# Patient Record
Sex: Female | Born: 1960 | Race: White | Hispanic: No | Marital: Married | Smoking: Never smoker
Health system: Southern US, Community
[De-identification: ages and names within clinical notes are randomized; demographics above are authoritative.]

## PROBLEM LIST (undated history)

## (undated) DIAGNOSIS — M1990 Unspecified osteoarthritis, unspecified site: Secondary | ICD-10-CM

## (undated) DIAGNOSIS — S32030A Wedge compression fracture of third lumbar vertebra, initial encounter for closed fracture: Secondary | ICD-10-CM

## (undated) DIAGNOSIS — M542 Cervicalgia: Secondary | ICD-10-CM

---

## 1997-12-22 ENCOUNTER — Other Ambulatory Visit: Admission: RE | Admit: 1997-12-22 | Discharge: 1997-12-22 | Payer: Self-pay | Admitting: Obstetrics and Gynecology

## 1999-01-31 ENCOUNTER — Other Ambulatory Visit: Admission: RE | Admit: 1999-01-31 | Discharge: 1999-01-31 | Payer: Self-pay | Admitting: Obstetrics and Gynecology

## 2000-05-14 ENCOUNTER — Other Ambulatory Visit: Admission: RE | Admit: 2000-05-14 | Discharge: 2000-05-14 | Payer: Self-pay | Admitting: Obstetrics and Gynecology

## 2001-06-19 ENCOUNTER — Other Ambulatory Visit: Admission: RE | Admit: 2001-06-19 | Discharge: 2001-06-19 | Payer: Self-pay | Admitting: Obstetrics and Gynecology

## 2001-10-31 ENCOUNTER — Ambulatory Visit (HOSPITAL_COMMUNITY): Admission: RE | Admit: 2001-10-31 | Discharge: 2001-10-31 | Payer: Self-pay | Admitting: Obstetrics and Gynecology

## 2001-10-31 ENCOUNTER — Encounter: Payer: Self-pay | Admitting: Obstetrics and Gynecology

## 2002-08-04 ENCOUNTER — Other Ambulatory Visit: Admission: RE | Admit: 2002-08-04 | Discharge: 2002-08-04 | Payer: Self-pay | Admitting: Obstetrics and Gynecology

## 2003-05-20 ENCOUNTER — Ambulatory Visit (HOSPITAL_COMMUNITY): Admission: RE | Admit: 2003-05-20 | Discharge: 2003-05-20 | Payer: Self-pay | Admitting: Obstetrics and Gynecology

## 2003-05-20 ENCOUNTER — Encounter: Payer: Self-pay | Admitting: Obstetrics and Gynecology

## 2003-08-19 ENCOUNTER — Other Ambulatory Visit: Admission: RE | Admit: 2003-08-19 | Discharge: 2003-08-19 | Payer: Self-pay | Admitting: Obstetrics and Gynecology

## 2004-10-19 ENCOUNTER — Other Ambulatory Visit: Admission: RE | Admit: 2004-10-19 | Discharge: 2004-10-19 | Payer: Self-pay | Admitting: Obstetrics and Gynecology

## 2004-12-29 ENCOUNTER — Ambulatory Visit: Payer: Self-pay | Admitting: Family Medicine

## 2005-08-09 ENCOUNTER — Ambulatory Visit: Payer: Self-pay | Admitting: Family Medicine

## 2005-12-26 ENCOUNTER — Ambulatory Visit (HOSPITAL_BASED_OUTPATIENT_CLINIC_OR_DEPARTMENT_OTHER): Admission: RE | Admit: 2005-12-26 | Discharge: 2005-12-26 | Payer: Self-pay | Admitting: Plastic Surgery

## 2005-12-26 ENCOUNTER — Encounter (INDEPENDENT_AMBULATORY_CARE_PROVIDER_SITE_OTHER): Payer: Self-pay | Admitting: Specialist

## 2006-05-06 ENCOUNTER — Ambulatory Visit: Payer: Self-pay | Admitting: Family Medicine

## 2006-06-25 ENCOUNTER — Encounter: Admission: RE | Admit: 2006-06-25 | Discharge: 2006-06-25 | Payer: Self-pay | Admitting: Family Medicine

## 2006-07-10 ENCOUNTER — Ambulatory Visit: Payer: Self-pay | Admitting: Family Medicine

## 2007-06-06 DIAGNOSIS — J45909 Unspecified asthma, uncomplicated: Secondary | ICD-10-CM | POA: Insufficient documentation

## 2007-06-06 DIAGNOSIS — J309 Allergic rhinitis, unspecified: Secondary | ICD-10-CM | POA: Insufficient documentation

## 2008-01-13 ENCOUNTER — Ambulatory Visit (HOSPITAL_BASED_OUTPATIENT_CLINIC_OR_DEPARTMENT_OTHER): Admission: RE | Admit: 2008-01-13 | Discharge: 2008-01-13 | Payer: Self-pay | Admitting: Orthopedic Surgery

## 2008-01-28 ENCOUNTER — Encounter: Admission: RE | Admit: 2008-01-28 | Discharge: 2008-01-28 | Payer: Self-pay | Admitting: Obstetrics and Gynecology

## 2008-10-27 ENCOUNTER — Ambulatory Visit: Payer: Self-pay | Admitting: Family Medicine

## 2008-10-27 DIAGNOSIS — J069 Acute upper respiratory infection, unspecified: Secondary | ICD-10-CM | POA: Insufficient documentation

## 2009-02-15 ENCOUNTER — Encounter: Admission: RE | Admit: 2009-02-15 | Discharge: 2009-02-15 | Payer: Self-pay | Admitting: Obstetrics and Gynecology

## 2009-02-18 ENCOUNTER — Encounter: Admission: RE | Admit: 2009-02-18 | Discharge: 2009-02-18 | Payer: Self-pay | Admitting: Obstetrics and Gynecology

## 2009-03-08 ENCOUNTER — Telehealth: Payer: Self-pay | Admitting: Family Medicine

## 2009-03-08 DIAGNOSIS — G510 Bell's palsy: Secondary | ICD-10-CM | POA: Insufficient documentation

## 2009-03-09 ENCOUNTER — Ambulatory Visit: Payer: Self-pay | Admitting: Family Medicine

## 2009-03-23 ENCOUNTER — Ambulatory Visit: Payer: Self-pay | Admitting: Family Medicine

## 2009-07-12 ENCOUNTER — Ambulatory Visit: Payer: Self-pay | Admitting: Family Medicine

## 2010-03-20 ENCOUNTER — Telehealth: Payer: Self-pay | Admitting: Family Medicine

## 2010-05-04 ENCOUNTER — Ambulatory Visit (HOSPITAL_COMMUNITY): Admission: RE | Admit: 2010-05-04 | Discharge: 2010-05-04 | Payer: Self-pay | Admitting: Obstetrics and Gynecology

## 2010-07-11 ENCOUNTER — Ambulatory Visit: Payer: Self-pay | Admitting: Family Medicine

## 2010-10-02 ENCOUNTER — Encounter: Payer: Self-pay | Admitting: Obstetrics and Gynecology

## 2010-10-12 NOTE — Assessment & Plan Note (Signed)
Summary: SINUS PROBLEMS // RS   Vital Signs:  Patient profile:   50 year old female Height:      65 inches Weight:      158 pounds BMI:     26.39 Temp:     98.3 degrees F oral BP sitting:   108 / 74  (left arm) Cuff size:   regular  Vitals Entered By: Kern Reap CMA Duncan Dull) (July 11, 2049 12:39 PM) CC: sinus pressure   CC:  sinus pressure.  History of Present Illness: Carol Branch  is a 50 year old, married female, nonsmoker, who comes in for evaluation of allergic rhinitis unresponsiveto her usual daily, Claritin.  She said symptoms now for about 3 weeks.  No cough no wheezing.  Allergies (verified): No Known Drug Allergies  Past History:  Past medical, surgical, family and social histories (including risk factors) reviewed for relevance to current acute and chronic problems.  Past Medical History: Reviewed history from 06/06/2007 and no changes required. Allergic rhinitis Asthma    last seen 08/09/05  Past Surgical History: Reviewed history from 10/27/2008 and no changes required. outpatient breast implants childbirth x 1  Family History: Reviewed history from 10/27/2008 and no changes required. father has a history of psoriasis, and BPH.  Mother has a history of diabetes.  One brother in good health  Social History: Reviewed history from 10/27/2008 and no changes required. Married Never Smoked Alcohol use-no Drug use-no Regular exercise-yes  Review of Systems      See HPI  Physical Exam  General:  Well-developed,well-nourished,in no acute distress; alert,appropriate and cooperative throughout examination Head:  Normocephalic and atraumatic without obvious abnormalities. No apparent alopecia or balding. Eyes:  No corneal or conjunctival inflammation noted. EOMI. Perrla. Funduscopic exam benign, without hemorrhages, exudates or papilledema. Vision grossly normal. Ears:  External ear exam shows no significant lesions or deformities.  Otoscopic  examination reveals clear canals, tympanic membranes are intact bilaterally without bulging, retraction, inflammation or discharge. Hearing is grossly normal bilaterally. Nose:  External nasal examination shows no deformity or inflammation. Nasal mucosa are pink and moist without lesions or exudates. Mouth:  Oral mucosa and oropharynx without lesions or exudates.  Teeth in good repair. Neck:  No deformities, masses, or tenderness noted.   Impression & Recommendations:  Problem # 1:  ALLERGIC RHINITIS (ICD-477.9) Assessment Deteriorated  Her updated medication list for this problem includes:    Fluticasone Propionate 50 Mcg/act Susp (Fluticasone propionate) .Marland Kitchen... 2 sprays per nostril two times a day  Complete Medication List: 1)  Pulmicort Flexhaler 180 Mcg/act Aepb (Budesonide) .... One puff two times a day 2)  Fluticasone Propionate 50 Mcg/act Susp (Fluticasone propionate) .... 2 sprays per nostril two times a day 3)  Prednisone 20 Mg Tabs (Prednisone) .... Uad  Patient Instructions: 1)  prednisone 20 mg, take two tabs x 3 days, one x 3 days, a half x 3 days, then half a tablet Monday, Wednesday, Friday, for a two week taper. 2)  When you stop the prednisone, then  taking the Claritin, along with one shot of steroid nasal spray up each nostril daily Prescriptions: FLUTICASONE PROPIONATE 50 MCG/ACT SUSP (FLUTICASONE PROPIONATE) 2 sprays per nostril two times a day  #1 x 3   Entered and Authorized by:   Roderick Pee MD   Signed by:   Roderick Pee MD on 07/11/2010   Method used:   Electronically to        [facility] [address] [phone]* (retail)  [address]       [place], [place]  [address]       Ph: [phone] or [phone]       Fax: [phone]   RxID:   [id number] PREDNISONE 20 MG TABS (PREDNISONE) UAD  #30 x 1   Entered and Authorized by:   Roderick Pee MD   Signed by:   Roderick Pee MD on 07/11/2010   Method used:   Electronically  to        [facility] [address] [phone]* (retail)       [address]       [place], [place]  [address]       Ph: [phone] or [phone]       Fax: [phone]   RxID:   [id number]    Orders Added: 1)  Est. Patient Level III [20254]

## 2010-10-12 NOTE — Progress Notes (Signed)
Summary: Pt has questions re: MONO      Phone Note Call from Patient Call back at [phone] cell   Caller: Patient Summary of Call: Pt has questions re: MONO.  Req that Dr Tawanna Cooler call her asap.  Initial call taken by: Lucy Antigua,  March 20, 2010 4:42 PM  Follow-up for Phone Call        deb please call to see if you can answer her questions Follow-up by: Roderick Pee MD,  March 21, 2010 8:27 AM  Additional Follow-up for Phone Call Additional follow up Details #1::        Pt already spoke to Dr. Alwyn Ren, and questions were answered. Additional Follow-up by: Lynann Beaver CMA,  March 21, 2010 8:44 AM

## 2010-10-26 ENCOUNTER — Ambulatory Visit (INDEPENDENT_AMBULATORY_CARE_PROVIDER_SITE_OTHER): Payer: BC Managed Care – PPO | Admitting: Family Medicine

## 2010-10-26 ENCOUNTER — Encounter: Payer: Self-pay | Admitting: Family Medicine

## 2010-10-26 ENCOUNTER — Telehealth: Payer: Self-pay | Admitting: Family Medicine

## 2010-10-26 VITALS — BP 110/76 | Temp 98.5°F | Ht 65.0 in | Wt 163.0 lb

## 2010-10-26 DIAGNOSIS — L600 Ingrowing nail: Secondary | ICD-10-CM

## 2010-10-26 NOTE — Telephone Encounter (Signed)
Has an ingrown toenail please advise pt.

## 2010-10-26 NOTE — Telephone Encounter (Signed)
Office visit today

## 2010-10-26 NOTE — Patient Instructions (Signed)
Keep your foot elevated tonight with ice.  Warm soaks daily until it heals, starting tomorrow.  Return p.r.n. or

## 2010-10-26 NOTE — Progress Notes (Signed)
  Subjective:    Patient ID: Carol Branch, female    DOB: [birth year], 50 y.o.   MRN: [id number]  HPI Carol Branch  is a 50 year old female Comes in today as an acute patient for evaluation of pain on the medial portion of her right great toe.  She states, that she's had ingrown toenails before, however, she is able to trim her , toenails, and resolve the problem.  This time it will get better.   Review of Systems Otherwise negative    Objective:   Physical Exam Well-developed well-nourished, female, in no acute distress.  Examination of the right great toe shows the medial portion is ingrown.  There is no erythema, and no pus       Assessment & Plan:  Ingrown toenail.  Patient was taken to the treatment room after verbal informed consent, 1% Xylocaine was used as an anesthetic, after a Betadine prep.  A third of the toenail was excised because it was severely ingrown.  Dry sterile dressing was applied.  She tolerated the procedure with no complications  Advised to elevate her foot tonight and warm soaks daily.  Return p.r.n.

## 2010-11-23 ENCOUNTER — Other Ambulatory Visit: Payer: Self-pay | Admitting: Family Medicine

## 2011-01-23 NOTE — Op Note (Signed)
Carol Branch, Carol Branch           ACCOUNT NO.:  [id number]   MEDICAL RECORD NO.:  [id number]          PATIENT TYPE:  AMB   LOCATION:  DSC                          FACILITY:  [facility]   PHYSICIAN:  Leonides Grills, M.D.     DATE OF BIRTH:  October 14, 1960   DATE OF PROCEDURE:  01/13/2008  DATE OF DISCHARGE:                               OPERATIVE REPORT   PREOPERATIVE DIAGNOSIS:  Left hallux rigidus.   POSTOPERATIVE DIAGNOSIS:  Left hallux rigidus.   OPERATION:  1. Left great toe cheilectomy.  2. Debridement drilling osteochondral lesion great toe metatarsal      head.   ANESTHESIA:  General.   SURGEON:  Leonides Grills, MD   ASSISTANT:  Richardean Canal, PA-C   ESTIMATED BLOOD LOSS:  Minimal.   TOURNIQUET TIME:  Approximately half hour.   COMPLICATIONS:  None.   DISPOSITION:  Stable to PR.   INDICATIONS:  This is a 50 year old female with longstanding dorsal left  great toe pain that was interfering with her life to the point she  cannot do what she wants to do.  She was consented for the above  procedure.  All risks which include infection or vessel injury,  persistent pain, worsening pain, prolonged recovery, stiffness,  arthritis, possibility of effusion versus hemiarthroplasty in future  were all explained.  Questions were encouraged and answered.   PROCEDURE:  The patient was brought to the operating room, placed in  supine position, after adequate general endotracheal anesthesia was  administered as well as Ancef 1 g IV piggyback.  The left lower  extremity was then prepped and draped in a sterile manner.  Over  proximally placed thigh tourniquet. The limb was gravity exsanguinated.  The tourniquet was elevated to 290 mmHg.  A longitudinal incision over  the dorsomedial aspect of the left great toe MTP joint was then made.  Dissection was carried down through the skin and hemostasis was  obtained.  A longitudinal capsulotomy was then made protecting the EHL  and its tendon  sheath.  Soft tissue was elevated on medial and lateral  aspects of the dorsal first metatarsal head, as well as the base of the  proximal phalanx of the great toe.  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  With a sagittal saw, this was  then removed.  Once this was removed, there was also a osteochondral  lesion centrally located within the head and this was also debrided with  a House curette.  Once this was done, multiple 2-mm drill holes were  placed.  This lesion was approximately 3 mm in size and was centrally  located dorsal aspect of the head.  The base of the proximal phalanx  spur was then removed with a rongeur.  Both gutters were inspected and  there was no pathology.  The joint in the area was copiously irrigated  with normal saline.  Bone wax was applied to exposed bony surfaces.  The  area was copiously irrigated once again with normal saline.  Tourniquet  was deflated.  Hemostasis was obtained.  Capsule was closed with  3-0  Vicryl.  Subcu was closed with 3-0 Vicryl.  Skin was closed with 4-0  nylon.  Sterile dressing was applied.  Hard sole shoes applied.  The  patient stable to the PR.      Leonides Grills, M.D.  Electronically Signed     PB/MEDQ  D:  01/13/2008  T:  01/13/2008  Job:  [id number]

## 2011-01-25 ENCOUNTER — Other Ambulatory Visit: Payer: Self-pay | Admitting: Family Medicine

## 2011-01-26 ENCOUNTER — Other Ambulatory Visit: Payer: Self-pay | Admitting: Family Medicine

## 2011-01-26 NOTE — Op Note (Signed)
Carol Branch, Carol Branch           ACCOUNT NO.:  [id number]   MEDICAL RECORD NO.:  [id number]          PATIENT TYPE:  AMB   LOCATION:  DSC                          FACILITY:  [facility]   PHYSICIAN:  Alfredia Ferguson, M.D.  DATE OF BIRTH:  20-Nov-1960   DATE OF PROCEDURE:  12/26/2005  DATE OF DISCHARGE:                                 OPERATIVE REPORT   PREOPERATIVE DIAGNOSIS:  1.  6-mm pigmented nevus right neck.  2.  6-mm pigmented nevus right lateral shoulder.  3.  5-mm pigmented nevus glabellar area.   POSTOPERATIVE DIAGNOSIS:  1.  6-mm pigmented nevus right neck.  2.  6-mm pigmented nevus right lateral shoulder.  3.  5-mm pigmented nevus glabellar area.   OPERATION:  Excision of pigmented nevi x3.   SURGEON:  Alfredia Ferguson, M.D.   ANESTHESIA:  2% Xylocaine with 1:100,000 epinephrine.   INDICATIONS FOR SURGERY:  This is a 50 year old woman who has some nevi that  are growing.  The ones on her shoulder are in the area of her bra strap and  she would like to have them removed.  She understands she is trading what  she has for a permanent and potentially unsightly scar.  In spite of that,  she wished to proceed.   DESCRIPTION OF PROCEDURE:  Skin marks were placed in an elliptical fashion  around each of the three lesion.  Local anesthesia was infiltrated.  The  right neck and shoulder area were prepped with Betadine and draped with  sterile drapes.  Elliptical excisions of the two lesions were carried out.  The specimen was submitted separately for pathology.  Each skin edge of the  two incisions was undermined for several millimeters.  Hemostasis was  accomplished using pressure.  The wounds were closed with interrupted 6-0  nylon suture.  Attention was directed to the glabellar area where an  identical excision procedure was carried out.  The wound edges were  undermined and closure was carried out with interrupted 6-0 nylon suture.  All three areas were cleansed and dried.   Light dressings were applied.  Follow up will be provided next week in my office.      Alfredia Ferguson, M.D.  Electronically Signed     WBB/MEDQ  D:  12/26/2005  T:  12/26/2005  Job:  [id number]

## 2011-03-21 ENCOUNTER — Other Ambulatory Visit: Payer: Self-pay | Admitting: Obstetrics and Gynecology

## 2011-03-28 ENCOUNTER — Other Ambulatory Visit: Payer: Self-pay | Admitting: Family Medicine

## 2011-07-13 ENCOUNTER — Other Ambulatory Visit: Payer: Self-pay | Admitting: Family Medicine

## 2011-09-14 ENCOUNTER — Other Ambulatory Visit (HOSPITAL_COMMUNITY): Payer: Self-pay | Admitting: Obstetrics and Gynecology

## 2011-09-14 DIAGNOSIS — Z1231 Encounter for screening mammogram for malignant neoplasm of breast: Secondary | ICD-10-CM

## 2011-10-15 ENCOUNTER — Other Ambulatory Visit: Payer: Self-pay | Admitting: *Deleted

## 2011-10-15 ENCOUNTER — Ambulatory Visit (HOSPITAL_COMMUNITY)
Admission: RE | Admit: 2011-10-15 | Discharge: 2011-10-15 | Disposition: A | Discharge status: Home / Self Care | Payer: BC Managed Care – PPO | Source: Ambulatory Visit | Attending: Obstetrics and Gynecology | Admitting: Obstetrics and Gynecology

## 2011-10-15 DIAGNOSIS — Z1231 Encounter for screening mammogram for malignant neoplasm of breast: Secondary | ICD-10-CM | POA: Insufficient documentation

## 2011-10-15 MED ORDER — FLUTICASONE PROPIONATE 50 MCG/ACT NA SUSP: 2.0000 | Freq: Two times a day (BID) | NASAL | Status: DC

## 2011-11-08 ENCOUNTER — Other Ambulatory Visit: Payer: Self-pay | Admitting: Family Medicine

## 2012-01-29 ENCOUNTER — Other Ambulatory Visit: Payer: Self-pay | Admitting: Family Medicine

## 2012-03-06 ENCOUNTER — Ambulatory Visit (INDEPENDENT_AMBULATORY_CARE_PROVIDER_SITE_OTHER): Payer: BC Managed Care – PPO | Admitting: Family Medicine

## 2012-03-06 ENCOUNTER — Encounter: Payer: Self-pay | Admitting: Family Medicine

## 2012-03-06 VITALS — BP 120/80 | Temp 98.1°F | Wt 153.0 lb

## 2012-03-06 DIAGNOSIS — J309 Allergic rhinitis, unspecified: Secondary | ICD-10-CM

## 2012-03-06 DIAGNOSIS — J45909 Unspecified asthma, uncomplicated: Secondary | ICD-10-CM

## 2012-03-06 DIAGNOSIS — R5383 Other fatigue: Secondary | ICD-10-CM

## 2012-03-06 DIAGNOSIS — R5381 Other malaise: Secondary | ICD-10-CM

## 2012-03-06 MED ORDER — CITALOPRAM HYDROBROMIDE 20 MG PO TABS: 20.0000 mg | ORAL_TABLET | Freq: Every day | ORAL | Status: DC

## 2012-03-06 MED ORDER — BUDESONIDE 180 MCG/ACT IN AEPB: 1.0000 | INHALATION_SPRAY | Freq: Two times a day (BID) | RESPIRATORY_TRACT | Status: DC

## 2012-03-06 MED ORDER — PIRBUTEROL ACETATE 200 MCG/INH IN AERB: 2.0000 | INHALATION_SPRAY | Freq: Four times a day (QID) | RESPIRATORY_TRACT | Status: DC

## 2012-03-06 MED ORDER — FLUTICASONE PROPIONATE 50 MCG/ACT NA SUSP: 1.0000 | Freq: Every day | NASAL | Status: DC

## 2012-03-06 NOTE — Progress Notes (Signed)
  Subjective:    Patient ID: Carol Branch, female    DOB: 1961-07-08, 51 y.o.   MRN: [id number]  HPI Carol Branch is a 51 year old married female nonsmoker who comes in today for 2 problems  She has a history of underlying allergic rhinitis and asthma. Her asthma at UC flares up in the spring and stops in the fall. She does not take her medication during the winter. She exercises 5 days weekly and uses albuterol one puff prior to exercise because she also has exercise-induced asthma. She uses Flonase for allergic rhinitis. GYN care by Dr. Tenny Craw  She has light skin and light eyes were also going to do a thorough skin exam.  Her other concern is fatigue she said this occurred about 10 years ago we gave her a medication that helped. She thinks is related to some mild depression.   Review of Systems    general pulmonary dermatologic review of systems otherwise negative Objective:   Physical Exam Well-developed well-nourished female no acute distress HEENT negative neck was supple no adenopathy lungs are clear total body skin exam normal       Assessment & Plan:  Healthy female  Asthma continue current therapy suggest she take one puff of Pulmicort all year-round even when she's asymptomatic in the winter  History of mild depression add Celexa 10 mg each bedtime

## 2012-03-06 NOTE — Patient Instructions (Addendum)
Continue your current medications  I would recommend you take one puff of Pulmicort in the winter even  your asymptomatic during that time  Celexa 20 mg one half tablet at bedtime for 6 months  Return in one year sooner if any problems

## 2012-06-09 ENCOUNTER — Other Ambulatory Visit: Payer: Self-pay | Admitting: Emergency Medicine

## 2012-09-05 ENCOUNTER — Telehealth: Payer: Self-pay | Admitting: Family Medicine

## 2012-09-05 NOTE — Telephone Encounter (Signed)
Attempted call back to patient, no answer.

## 2012-09-06 ENCOUNTER — Other Ambulatory Visit: Payer: Self-pay | Admitting: Family Medicine

## 2012-09-08 ENCOUNTER — Ambulatory Visit (INDEPENDENT_AMBULATORY_CARE_PROVIDER_SITE_OTHER): Payer: BC Managed Care – PPO | Admitting: Family Medicine

## 2012-09-08 ENCOUNTER — Encounter: Payer: Self-pay | Admitting: Family Medicine

## 2012-09-08 VITALS — BP 120/70 | HR 69 | Temp 98.1°F | Wt 165.0 lb

## 2012-09-08 DIAGNOSIS — J069 Acute upper respiratory infection, unspecified: Secondary | ICD-10-CM

## 2012-09-08 DIAGNOSIS — J45909 Unspecified asthma, uncomplicated: Secondary | ICD-10-CM

## 2012-09-08 MED ORDER — PREDNISONE 20 MG PO TABS: ORAL_TABLET | ORAL | Status: DC

## 2012-09-08 NOTE — Progress Notes (Signed)
Chief Complaint  Patient presents with  . Bronchitis    coughing up yellow mucus; heavy chest feelling     HPI:  Acute visit for URI: --started: 2 weeks -symptoms:nasal congestion, sore throat, productive cough, wheezing, mild SOB -denies:fever, NVD, tooth pain, exposure to flu -has tried: has been using pulmicort and maxair once daily, flonase and claritin -sick contacts: colds -Hx of: asthma, no hospitalizations for her asthma, last used prednisone 1 year ago   ROS: See pertinent positives and negatives per HPI.  No past medical history on file.  No family history on file.  History   Social History  . Marital Status: Married    Spouse Name: N/A    Number of Children: N/A  . Years of Education: N/A   Social History Main Topics  . Smoking status: Never Smoker   . Smokeless tobacco: None  . Alcohol Use: 1.8 oz/week    3 Glasses of wine per week  . Drug Use: No  . Sexually Active:    Other Topics Concern  . None   Social History Narrative  . None    Current outpatient prescriptions:budesonide (PULMICORT FLEXHALER) 180 MCG/ACT inhaler, Inhale 1 puff into the lungs 2 (two) times daily., Disp: 3 each, Rfl: 3;  citalopram (CELEXA) 20 MG tablet, Take 1 tablet (20 mg total) by mouth daily., Disp: 50 tablet, Rfl: 3;  fluticasone (FLONASE) 50 MCG/ACT nasal spray, Place 1 spray into the nose daily., Disp: 16 g, Rfl: 11 pirbuterol (MAXAIR) 200 MCG/INH inhaler, Inhale 2 puffs into the lungs 4 (four) times daily., Disp: 1 Inhaler, Rfl: 2;  predniSONE (DELTASONE) 20 MG tablet, USE AS DIRECTED, Disp: 30 tablet, Rfl: 1;  predniSONE (DELTASONE) 20 MG tablet, 3 tablets daily for 2 days, then 2 tablets daily for 3 days, then 1 tablet daily for 3 days, Disp: 15 tablet, Rfl: 0  EXAM:  Filed Vitals:   09/08/12 0921  BP: 120/70  Pulse: 69  Temp: 98.1 F (36.7 C)    There is no height on file to calculate BMI.  GENERAL: vitals reviewed and listed above, alert, oriented, appears  well hydrated and in no acute distress  HEENT: atraumatic, conjunttiva clear, no obvious abnormalities on inspection of external nose and ears  NECK: no obvious masses on inspection  LUNGS: clear to auscultation bilaterally, no wheezes, rales or rhonchi, good air movement  CV: HRRR, no peripheral edema  MS: moves all extremities without noticeable abnormality  PSYCH: pleasant and cooperative, no obvious depression or anxiety  ASSESSMENT AND PLAN:  Discussed the following assessment and plan:  1. Asthma - mild exacerbation --> prednisone sort course and prn SABA  2. VIRAL URI , resolved   -acute flare - likely viral induced, discussed risks benefits of tx options and will do short course of prednisone for mild exacerbation -Patient advised to return or notify a doctor immediately if symptoms worsen or persist or new concerns arise.  Patient Instructions  -As we discussed, we have prescribed a new medication (PREDNISONE) for you at this appointment. We discussed the common and serious potential adverse effects of this medication and you can review these and more with the pharmacist when you pick up your medication.  Please follow the instructions for use carefully and notify us immediately if you have any problems taking this medication.  -Use your maxair as needed every 4-6 hours for wheezing, shortness of breath of cough  -follow up with your doctor if worsening or not improving  Colin Benton R.

## 2012-09-08 NOTE — Patient Instructions (Signed)
-  As we discussed, we have prescribed a new medication (PREDNISONE) for you at this appointment. We discussed the common and serious potential adverse effects of this medication and you can review these and more with the pharmacist when you pick up your medication.  Please follow the instructions for use carefully and notify us immediately if you have any problems taking this medication.  -Use your maxair as needed every 4-6 hours for wheezing, shortness of breath of cough  -follow up with your doctor if worsening or not improving

## 2012-09-18 ENCOUNTER — Other Ambulatory Visit: Payer: Self-pay | Admitting: Family Medicine

## 2012-10-02 ENCOUNTER — Other Ambulatory Visit (INDEPENDENT_AMBULATORY_CARE_PROVIDER_SITE_OTHER): Payer: BC Managed Care – PPO

## 2012-10-02 DIAGNOSIS — Z0000 Encounter for general adult medical examination without abnormal findings: Secondary | ICD-10-CM

## 2012-10-02 LAB — POCT URINALYSIS DIPSTICK
Glucose, UA: NEGATIVE
Ketones, UA: NEGATIVE
Leukocytes, UA: NEGATIVE
Protein, UA: NEGATIVE
Urobilinogen, UA: 0.2

## 2012-10-02 LAB — BASIC METABOLIC PANEL
BUN: 16 mg/dL (ref 6–23)
CO2: 29 mEq/L (ref 19–32)
Calcium: 9.1 mg/dL (ref 8.4–10.5)
Chloride: 103 mEq/L (ref 96–112)
Creatinine, Ser: 0.8 mg/dL (ref 0.4–1.2)
Glucose, Bld: 101 mg/dL — ABNORMAL HIGH (ref 70–99)

## 2012-10-02 LAB — CBC WITH DIFFERENTIAL/PLATELET
Basophils Absolute: 0 10*3/uL (ref 0.0–0.1)
Basophils Relative: 0.4 % (ref 0.0–3.0)
Eosinophils Absolute: 0.1 10*3/uL (ref 0.0–0.7)
Lymphocytes Relative: 29.7 % (ref 12.0–46.0)
MCHC: 34.1 g/dL (ref 30.0–36.0)
MCV: 92.1 fl (ref 78.0–100.0)
Monocytes Absolute: 0.4 10*3/uL (ref 0.1–1.0)
Neutrophils Relative %: 60.4 % (ref 43.0–77.0)
RBC: 4.54 Mil/uL (ref 3.87–5.11)
RDW: 13 % (ref 11.5–14.6)

## 2012-10-02 LAB — LIPID PANEL
Cholesterol: 232 mg/dL — ABNORMAL HIGH (ref 0–200)
Triglycerides: 77 mg/dL (ref 0.0–149.0)

## 2012-10-02 LAB — HEPATIC FUNCTION PANEL
Albumin: 3.9 g/dL (ref 3.5–5.2)
Alkaline Phosphatase: 45 U/L (ref 39–117)
Bilirubin, Direct: 0 mg/dL (ref 0.0–0.3)
Total Protein: 7 g/dL (ref 6.0–8.3)

## 2012-10-09 ENCOUNTER — Encounter: Payer: Self-pay | Admitting: Family Medicine

## 2012-10-09 ENCOUNTER — Ambulatory Visit (INDEPENDENT_AMBULATORY_CARE_PROVIDER_SITE_OTHER): Payer: BC Managed Care – PPO | Admitting: Family Medicine

## 2012-10-09 VITALS — BP 110/80 | Temp 97.3°F | Ht 69.0 in | Wt 168.0 lb

## 2012-10-09 DIAGNOSIS — J309 Allergic rhinitis, unspecified: Secondary | ICD-10-CM

## 2012-10-09 DIAGNOSIS — F32A Depression, unspecified: Secondary | ICD-10-CM

## 2012-10-09 DIAGNOSIS — J45909 Unspecified asthma, uncomplicated: Secondary | ICD-10-CM

## 2012-10-09 DIAGNOSIS — F3289 Other specified depressive episodes: Secondary | ICD-10-CM

## 2012-10-09 DIAGNOSIS — F329 Major depressive disorder, single episode, unspecified: Secondary | ICD-10-CM

## 2012-10-09 MED ORDER — BUPROPION HCL ER (SR) 100 MG PO TB12: ORAL_TABLET | ORAL | Status: DC

## 2012-10-09 NOTE — Patient Instructions (Signed)
Begin Wellbutrin 1 tablet daily in the morning  Taper the Celexa as follows............. one tablet Monday Wednesday Friday for 2 weeks then one tablet Monday Thursday for 2 weeks and then stop  Return in 6 weeks for followup sooner if any problems

## 2012-10-09 NOTE — Progress Notes (Signed)
  Subjective:    Patient ID: Carol Branch, female    DOB: 07/29/1961, 52 y.o.   MRN: [id number]  HPI Carol Branch is a 52 year old married female,,,,,,,,, in the process of getting separated from her husband she states he's an alcoholic,,,,,,,,,,, nonsmoker who comes in today for general physical examination  She has a history of allergic rhinitis and asthma she takes Pulmicort 1 puff twice a day  She also takes 20 mg of Celexa bedtime but she feels fatigued. She's currently seeing a counselor because of a marital breakup. We discussed options she would like to try Wellbutrin.  She gets routine eye care, dental care, BSE monthly, and you mammography, colonoscopy 2014 and GI, seasonal flu shot 2013 tetanus 2006.  She has a Mirena IUD and sees Dr. Duane Lope yearly.   Review of Systems  Constitutional: Negative.   HENT: Negative.   Eyes: Negative.   Respiratory: Negative.   Cardiovascular: Negative.   Gastrointestinal: Negative.   Genitourinary: Negative.   Musculoskeletal: Negative.   Neurological: Negative.   Hematological: Negative.   Psychiatric/Behavioral: Negative.        Objective:   Physical Exam  Constitutional: She appears well-developed and well-nourished.  HENT:  Head: Normocephalic and atraumatic.  Right Ear: External ear normal.  Left Ear: External ear normal.  Nose: Nose normal.  Mouth/Throat: Oropharynx is clear and moist.  Eyes: EOM are normal. Pupils are equal, round, and reactive to light.  Neck: Normal range of motion. Neck supple. No thyromegaly present.  Cardiovascular: Normal rate, regular rhythm, normal heart sounds and intact distal pulses.  Exam reveals no gallop and no friction rub.   No murmur heard. Pulmonary/Chest: Effort normal and breath sounds normal.  Abdominal: Soft. Bowel sounds are normal. She exhibits no distension and no mass. There is no tenderness. There is no rebound.  Genitourinary:       Bilateral breast exam normal   Musculoskeletal: Normal range of motion.  Lymphadenopathy:    She has no cervical adenopathy.  Neurological: She is alert. She has normal reflexes. No cranial nerve deficit. She exhibits normal muscle tone. Coordination normal.  Skin: Skin is warm and dry.  Psychiatric: She has a normal mood and affect. Her behavior is normal. Judgment and thought content normal.          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

## 2012-10-20 ENCOUNTER — Other Ambulatory Visit (HOSPITAL_COMMUNITY): Payer: Self-pay | Admitting: Obstetrics and Gynecology

## 2012-10-20 DIAGNOSIS — Z1231 Encounter for screening mammogram for malignant neoplasm of breast: Secondary | ICD-10-CM

## 2012-11-01 ENCOUNTER — Other Ambulatory Visit: Payer: Self-pay | Admitting: Family Medicine

## 2012-11-20 ENCOUNTER — Ambulatory Visit: Payer: BC Managed Care – PPO | Admitting: Family Medicine

## 2012-12-16 ENCOUNTER — Ambulatory Visit (HOSPITAL_COMMUNITY): Payer: BC Managed Care – PPO

## 2012-12-24 ENCOUNTER — Encounter: Payer: Self-pay | Admitting: Family Medicine

## 2012-12-24 ENCOUNTER — Ambulatory Visit (INDEPENDENT_AMBULATORY_CARE_PROVIDER_SITE_OTHER): Payer: BC Managed Care – PPO | Admitting: Family Medicine

## 2012-12-24 VITALS — BP 110/70 | Temp 98.1°F | Wt 163.0 lb

## 2012-12-24 DIAGNOSIS — F329 Major depressive disorder, single episode, unspecified: Secondary | ICD-10-CM

## 2012-12-24 DIAGNOSIS — M5090 Cervical disc disorder, unspecified, unspecified cervical region: Secondary | ICD-10-CM

## 2012-12-24 DIAGNOSIS — F32A Depression, unspecified: Secondary | ICD-10-CM

## 2012-12-24 DIAGNOSIS — J45909 Unspecified asthma, uncomplicated: Secondary | ICD-10-CM

## 2012-12-24 DIAGNOSIS — F3289 Other specified depressive episodes: Secondary | ICD-10-CM

## 2012-12-24 MED ORDER — BUDESONIDE 180 MCG/ACT IN AEPB: 1.0000 | INHALATION_SPRAY | Freq: Two times a day (BID) | RESPIRATORY_TRACT | Status: DC

## 2012-12-24 MED ORDER — TRAMADOL HCL 50 MG PO TABS: ORAL_TABLET | ORAL | Status: DC

## 2012-12-24 MED ORDER — CYCLOBENZAPRINE HCL 5 MG PO TABS: ORAL_TABLET | ORAL | Status: DC

## 2012-12-24 MED ORDER — BUPROPION HCL ER (SR) 100 MG PO TB12: ORAL_TABLET | ORAL | Status: DC

## 2012-12-24 NOTE — Patient Instructions (Signed)
Motrin 600 mg twice daily with food  Flexeril 5 mg and tramadol 50 mg......... One of each at bedtime when necessary for neck pain  Continue the Wellbutrin 100 mg daily   Return in 6 months for followup sooner if any problems

## 2012-12-24 NOTE — Progress Notes (Signed)
  Subjective:    Patient ID: Carol Branch, female    DOB: 01-27-1961, 52 y.o.   MRN: [id number]  HPI Carol Branch is a 52 year old married female who comes in today for followup  We stopped her Celexa and started Wellbutrin a couple months ago because she was having symptoms of depression along with marital issues. She and her husband are both going to counseling and things seem to be improving. She is satisfied with how she feels on the Wellbutrin and wishes to continue that dose.  She also has asthma and uses a Pulmicort inhaler and needs refills  She recently had a neurosurgical evaluation by Dr. Venetia Maxon for neck pain. He recommended physical therapy.   Review of Systems    review of systems otherwise negative Objective:   Physical Exam Well-developed well-nourished female no acute distress  Her mood is good she looks good she is smiling       Assessment & Plan:  Depression improved plan continue Wellbutrin and counseling  Cervical pain Motrin 600 twice a day Flexeril and tramadol  each bedtime when necessary  Asthma continue Pulmicort 1 puff twice a day

## 2013-01-02 ENCOUNTER — Telehealth: Payer: Self-pay | Admitting: Family Medicine

## 2013-01-02 DIAGNOSIS — M5090 Cervical disc disorder, unspecified, unspecified cervical region: Secondary | ICD-10-CM

## 2013-01-02 DIAGNOSIS — F329 Major depressive disorder, single episode, unspecified: Secondary | ICD-10-CM

## 2013-01-02 DIAGNOSIS — F32A Depression, unspecified: Secondary | ICD-10-CM

## 2013-01-02 DIAGNOSIS — J45909 Unspecified asthma, uncomplicated: Secondary | ICD-10-CM

## 2013-01-02 MED ORDER — CYCLOBENZAPRINE HCL 5 MG PO TABS: ORAL_TABLET | ORAL | Status: DC

## 2013-01-02 MED ORDER — TRAMADOL HCL 50 MG PO TABS: ORAL_TABLET | ORAL | Status: DC

## 2013-01-02 MED ORDER — BUPROPION HCL ER (SR) 100 MG PO TB12: ORAL_TABLET | ORAL | Status: DC

## 2013-01-02 MED ORDER — BUDESONIDE 180 MCG/ACT IN AEPB: 1.0000 | INHALATION_SPRAY | Freq: Two times a day (BID) | RESPIRATORY_TRACT | Status: DC

## 2013-01-02 NOTE — Telephone Encounter (Signed)
Patient called stating that she need refills of her pulmicort,tramadol,cyclobenzaprine, bupropion sent back to [facility]. Please assist.

## 2013-02-13 ENCOUNTER — Telehealth: Payer: Self-pay | Admitting: *Deleted

## 2013-02-13 DIAGNOSIS — F329 Major depressive disorder, single episode, unspecified: Secondary | ICD-10-CM

## 2013-02-13 DIAGNOSIS — F32A Depression, unspecified: Secondary | ICD-10-CM

## 2013-02-13 MED ORDER — BUPROPION HCL ER (SR) 100 MG PO TB12: ORAL_TABLET | ORAL | Status: DC

## 2013-02-13 NOTE — Telephone Encounter (Signed)
Wellbutrin increase to 2 in the morning.  Ok per Dr Tawanna Cooler.  Rx sent to pharmacy and Left message on machine for patient.

## 2013-06-24 ENCOUNTER — Other Ambulatory Visit: Payer: Self-pay | Admitting: Family Medicine

## 2013-07-18 ENCOUNTER — Other Ambulatory Visit: Payer: Self-pay | Admitting: Family Medicine

## 2013-10-28 ENCOUNTER — Other Ambulatory Visit: Payer: Self-pay | Admitting: Neurosurgery

## 2013-10-28 DIAGNOSIS — M545 Low back pain, unspecified: Secondary | ICD-10-CM

## 2013-11-01 ENCOUNTER — Ambulatory Visit
Admission: RE | Admit: 2013-11-01 | Discharge: 2013-11-01 | Disposition: A | Discharge status: Home / Self Care | Payer: BC Managed Care – PPO | Source: Ambulatory Visit | Attending: Neurosurgery | Admitting: Neurosurgery

## 2013-11-01 DIAGNOSIS — M545 Low back pain, unspecified: Secondary | ICD-10-CM

## 2013-11-12 ENCOUNTER — Other Ambulatory Visit: Payer: Self-pay | Admitting: Obstetrics and Gynecology

## 2013-12-05 ENCOUNTER — Other Ambulatory Visit: Payer: Self-pay | Admitting: Family Medicine

## 2014-01-14 ENCOUNTER — Other Ambulatory Visit: Payer: Self-pay | Admitting: Family Medicine

## 2014-01-18 ENCOUNTER — Other Ambulatory Visit: Payer: Self-pay | Admitting: Family Medicine

## 2014-01-25 ENCOUNTER — Encounter: Payer: Self-pay | Admitting: Family Medicine

## 2014-01-25 ENCOUNTER — Ambulatory Visit (INDEPENDENT_AMBULATORY_CARE_PROVIDER_SITE_OTHER): Payer: BC Managed Care – PPO | Admitting: Family Medicine

## 2014-01-25 VITALS — BP 102/68 | Temp 98.4°F | Wt 158.0 lb

## 2014-01-25 DIAGNOSIS — J309 Allergic rhinitis, unspecified: Secondary | ICD-10-CM

## 2014-01-25 DIAGNOSIS — F329 Major depressive disorder, single episode, unspecified: Secondary | ICD-10-CM

## 2014-01-25 DIAGNOSIS — F3289 Other specified depressive episodes: Secondary | ICD-10-CM

## 2014-01-25 DIAGNOSIS — F32A Depression, unspecified: Secondary | ICD-10-CM

## 2014-01-25 DIAGNOSIS — J45909 Unspecified asthma, uncomplicated: Secondary | ICD-10-CM

## 2014-01-25 MED ORDER — FLUTICASONE PROPIONATE 50 MCG/ACT NA SUSP: 1.0000 | Freq: Every day | NASAL | Status: DC

## 2014-01-25 MED ORDER — BUDESONIDE 180 MCG/ACT IN AEPB: 1.0000 | INHALATION_SPRAY | Freq: Two times a day (BID) | RESPIRATORY_TRACT | Status: DC

## 2014-01-25 NOTE — Patient Instructions (Signed)
Wellbutrin 100 mg.......... one half tablets daily  Pulmicort...Marland Kitchen.Marland Kitchen.Marland Kitchen. 1 puff daily....... if you developed a cold or any sign of wheezing increase the dose to one puff twice daily  Steroid nasal spray,,,,,,, one-shot each nostril at bedtime for allergic rhinitis

## 2014-01-25 NOTE — Progress Notes (Signed)
Pre visit review using our clinic review tool, if applicable. No additional management support is needed unless otherwise documented below in the visit note. 

## 2014-01-25 NOTE — Progress Notes (Signed)
   Subjective:    Patient ID: Carol Branch, female    DOB: 01/22/1961, 53 y.o.   MRN: [id number]  HPI I Carol Branch is a 53 year old recently divorced female nonsmoker who comes in today for yearly checkup because of a history of asthma allergic rhinitis and depression  She takes Pulmicort 1 puff daily and rarely uses the albuterol  She's a steroid nasal spray for allergic rhinitis.  She does have some cervical disc disease and takes tramadol 50 mg each bedtime when necessary for neck pain  She's also on Wellbutrin 150 mg daily. She and her husband went to group counseling however that was not productive she's currently living alone  She gets routine eye care, dental care, BSE monthly, and you mammography, recent colonoscopy normal  She has a Mirena IUD and sees her GYN yearly   Review of Systems Review of systems otherwise negative    Objective:   Physical Exam HEENT were negative neck was supple no adenopathy thyroid was normal. Cardiopulmonary exam normal abdominal exam normal total body skin exam normal. She has a diet mark on her left hip her she got an injection from Dr. 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

## 2014-06-08 ENCOUNTER — Other Ambulatory Visit: Payer: Self-pay | Admitting: Family Medicine

## 2014-06-17 ENCOUNTER — Telehealth: Payer: Self-pay | Admitting: *Deleted

## 2014-06-17 ENCOUNTER — Telehealth: Payer: Self-pay | Admitting: Family Medicine

## 2014-06-17 NOTE — Telephone Encounter (Signed)
Patient Information:  Caller Name: [name]  Phone: [phone]  Patient: Carol Branch, Carol Branch  Gender: Female  DOB: 06/15/1961  Age: 53 Years  PCP: Kelle Dartingodd, Jeffrey [facility](Family Practice)  Pregnant: No  Office Follow Up:  Does the office need to follow up with this patient?: No  Instructions For The Office: N/A  RN Note:  Will schedule appt.  Symptoms  Reason For Call & Symptoms: Has asthma and reports a mild persistent tightness in center of chest for over 2 weeks with intermittent cough. Denies wheezing or SOB. She is able to speak easily in complete sentences during call and only minimal coughing heard by triager. Pt would like appt for tomorrow.  Reviewed Health History In EMR: Yes  Reviewed Medications In EMR: Yes  Reviewed Allergies In EMR: Yes  Reviewed Surgeries / Procedures: Yes  Date of Onset of Symptoms: 05/31/2014  Treatments Tried: inhaler  Treatments Tried Worked: Yes OB / GYN:  LMP: Unknown  Guideline(s) Used:  Asthma Attack  Disposition Per Guideline:   See Today or Tomorrow in Office  Reason For Disposition Reached:   Mild asthma attack (e.g., no SOB at rest, mild SOB with walking, speaks normally in sentences, mild wheezing) and persists > 24 hours on appropriate treatment  Advice Given:  Quick-Relief Asthma Medicine:   Start your quick-relief medicine (e.g., albuterol, salbutamol) at the first sign of any coughing or shortness of breath (don't wait for wheezing). Use your inhaler (2 puffs each time) or nebulizer every 4 hours. Continue the quick-relief medicine until you have not wheezed or coughed for 48 hours.  Long-Term-Control Asthma Medicine:  If you are using a controller medicine (e.g., inhaled steroids or cromolyn), continue to take it as directed.  Drinking Liquids:  Try to drink normal amount of liquids (e.g., water). Being adequately hydrated makes it easier to cough up the sticky lung mucus.  Humidifier:   If the air is dry, use a cool mist  humidifier to prevent drying of the upper airway.  Remove Allergens:  Take a shower to remove pollens, animal dander, or other allergens from the body and hair.  Avoid Triggers:  Avoid known triggers of asthma attacks (e.g., tobacco smoke, cats, other pets, feather pillows, exercise).  Call Back If:  Inhaled asthma medicine (nebulizer or inhaler) is needed more often than every 4 hours  Wheezing has not completely cleared after 5 days  You become worse.  Patient Will Follow Care Advice:  YES  Appointment Scheduled:  06/18/2014 08:00:14 Appointment Scheduled Provider:  Selena BattenKim (TEXT 1st, after 20 mins can call), Dahlia ClientHannah [facility](Family Practice)

## 2014-06-17 NOTE — Telephone Encounter (Signed)
I called the pt and advised her the message she left with the call-a-nurse was reviewed by Dr Selena BattenKim and she suggested the pt see someone today due to chest tightness and she should not wait until tomorrow.  Patient stated she is not having heart-related chest pain as she only has a "choking" sensation and has had this for 2 weeks and she cannot come in today and will see Dr Selena BattenKim tomorrow.  Message forwarded to Dr Selena BattenKim.

## 2014-06-18 ENCOUNTER — Encounter: Payer: Self-pay | Admitting: Family Medicine

## 2014-06-18 ENCOUNTER — Ambulatory Visit (INDEPENDENT_AMBULATORY_CARE_PROVIDER_SITE_OTHER): Payer: BC Managed Care – PPO | Admitting: Family Medicine

## 2014-06-18 VITALS — BP 100/72 | HR 62 | Temp 97.9°F | Ht 69.0 in

## 2014-06-18 DIAGNOSIS — J309 Allergic rhinitis, unspecified: Secondary | ICD-10-CM

## 2014-06-18 DIAGNOSIS — J4541 Moderate persistent asthma with (acute) exacerbation: Secondary | ICD-10-CM

## 2014-06-18 MED ORDER — PREDNISONE 20 MG PO TABS: 40.0000 mg | ORAL_TABLET | Freq: Every day | ORAL | Status: DC

## 2014-06-18 NOTE — Progress Notes (Signed)
No chief complaint on file.   HPI:  Cough/Asthma flare: -started: a few weeks ago with allergy issues -symptoms: cough - not much mucus production, drainage, nasal congestion, wheezing, mild SOB -denies:fever, SOB, NVD, tooth pain -has tried: pulmicort bid, flonase bid, no albuterol -sick contacts/travel/risks: denies flu exposure, tick exposure or or Ebola risks -Hx of: allergies ROS: See pertinent positives and negatives per HPI.  No past medical history on file.  No past surgical history on file.  No family history on file.  History   Social History  . Marital Status: Married    Spouse Name: N/A    Number of Children: N/A  . Years of Education: N/A   Social History Main Topics  . Smoking status: Never Smoker   . Smokeless tobacco: None  . Alcohol Use: 1.8 oz/week    3 Glasses of wine per week  . Drug Use: No  . Sexual Activity:    Other Topics Concern  . None   Social History Narrative  . None    Current outpatient prescriptions:budesonide (PULMICORT FLEXHALER) 180 MCG/ACT inhaler, Inhale 1 puff into the lungs 2 (two) times daily., Disp: 3 each, Rfl: 3;  buPROPion (WELLBUTRIN SR) 100 MG 12 hr tablet, TAKE 2 TABLETS BY MOUTH EVERY MORNING, Disp: 200 tablet, Rfl: 2;  fluticasone (FLONASE) 50 MCG/ACT nasal spray, Place 1 spray into both nostrils daily., Disp: 32 g, Rfl: 6 traMADol (ULTRAM) 50 MG tablet, 1 BY MOUTH EACH BEDTIME WHEN NECESSARY FOR NECK PAIN, Disp: 50 tablet, Rfl: 1;  predniSONE (DELTASONE) 20 MG tablet, Take 2 tablets (40 mg total) by mouth daily with breakfast., Disp: 8 tablet, Rfl: 0  EXAM:  Filed Vitals:   06/18/14 0802  BP: 100/72  Pulse: 62  Temp: 97.9 F (36.6 C)  O2:99% on RA  Body mass index is 0.00 kg/(m^2).  GENERAL: vitals reviewed and listed above, alert, oriented, appears well hydrated and in no acute distress  HEENT: atraumatic, conjunttiva clear, no obvious abnormalities on inspection of external nose and ears, normal  appearance of ear canals and TMs, clear nasal congestion, mild post oropharyngeal erythema with PND, no tonsillar edema or exudate, no sinus TTP  NECK: no obvious masses on inspection  LUNGS: clear to auscultation bilaterally, no wheezes, rales or rhonchi, good air movement  CV: HRRR, no peripheral edema  MS: moves all extremities without noticeable abnormality  PSYCH: pleasant and cooperative, no obvious depression or anxiety  ASSESSMENT AND PLAN:  Discussed the following assessment and plan:  Asthma with acute exacerbation, moderate persistent - Plan: predniSONE (DELTASONE) 20 MG tablet  Allergic rhinitis, unspecified allergic rhinitis type  - We discussed potential etiologies, with asthma exacerbation due to uncontrolled allergic rhinosinusitis likely. We discussed treatment side effects, likely course, antibiotic misuse, transmission, and signs of developing a serious illness. -of course, we advised to return or notify a doctor immediately if symptoms worsen or persist or new concerns arise.    Patient Instructions  Claritin daily for 1 month  -As we discussed, we have prescribed a new medication (PREDNISONE) for you at this appointment. We discussed the common and serious potential adverse effects of this medication and you can review these and more with the pharmacist when you pick up your medication.  Please follow the instructions for use carefully and notify us immediately if you have any problems taking this medication.      Kriste BasqueKIM, HANNAH R.

## 2014-06-18 NOTE — Progress Notes (Signed)
Pre visit review using our clinic review tool, if applicable. No additional management support is needed unless otherwise documented below in the visit note. 

## 2014-06-18 NOTE — Patient Instructions (Signed)
Claritin daily for 1 month  -As we discussed, we have prescribed a new medication (PREDNISONE) for you at this appointment. We discussed the common and serious potential adverse effects of this medication and you can review these and more with the pharmacist when you pick up your medication.  Please follow the instructions for use carefully and notify us immediately if you have any problems taking this medication.

## 2014-07-22 ENCOUNTER — Ambulatory Visit (INDEPENDENT_AMBULATORY_CARE_PROVIDER_SITE_OTHER): Payer: 59 | Admitting: Family Medicine

## 2014-07-22 ENCOUNTER — Encounter: Payer: Self-pay | Admitting: Family Medicine

## 2014-07-22 VITALS — BP 102/62 | HR 64 | Temp 98.3°F | Ht 69.0 in | Wt 168.5 lb

## 2014-07-22 DIAGNOSIS — R053 Chronic cough: Secondary | ICD-10-CM

## 2014-07-22 DIAGNOSIS — R05 Cough: Secondary | ICD-10-CM

## 2014-07-22 DIAGNOSIS — R300 Dysuria: Secondary | ICD-10-CM

## 2014-07-22 LAB — POCT URINALYSIS DIPSTICK
Bilirubin, UA: NEGATIVE
Glucose, UA: NEGATIVE
Ketones, UA: NEGATIVE
NITRITE UA: POSITIVE
Protein, UA: NEGATIVE
Spec Grav, UA: <=1.005
Urobilinogen, UA: 0.2
pH, UA: 7.5

## 2014-07-22 MED ORDER — CIPROFLOXACIN HCL 250 MG PO TABS: 250.0000 mg | ORAL_TABLET | Freq: Two times a day (BID) | ORAL | Status: DC

## 2014-07-22 NOTE — Progress Notes (Signed)
Pre visit review using our clinic review tool, if applicable. No additional management support is needed unless otherwise documented below in the visit note. 

## 2014-07-22 NOTE — Patient Instructions (Signed)
-  As we discussed, we have prescribed a new medication for you at this appointment. We discussed the common and serious potential adverse effects of this medication and you can review these and more with the pharmacist when you pick up your medication.  Please follow the instructions for use carefully and notify us immediately if you have any problems taking this medication.  -get chest xray  -follow up with PCP in 3-4 weeks about asthma

## 2014-07-22 NOTE — Addendum Note (Signed)
Addended by: Johnella MoloneyFUNDERBURK, Lorelei Heikkila A on: 07/22/2014 03:34 PM   Modules accepted: Orders

## 2014-07-22 NOTE — Progress Notes (Signed)
  HPI:  Acute visit for:  1) Dysuria: -started 4 days ago -symptoms: frequency, urgency, dysuria -denies: fever, nausea, vomiting, vaginal discharge, hematuria  2)Mild, persistent asthma: -on daily ICS and prn alb -after acute viral induce exacerbation last month felt much better after predinisone tx but with occ sensation of needing to cough and wants CXR -denies: SOB, DOE, hemoptysis, wheezing   ROS: See pertinent positives and negatives per HPI.  No past medical history on file.  No past surgical history on file.  No family history on file.  History   Social History  . Marital Status: Married    Spouse Name: N/A    Number of Children: N/A  . Years of Education: N/A   Social History Main Topics  . Smoking status: Never Smoker   . Smokeless tobacco: None  . Alcohol Use: 1.8 oz/week    3 Glasses of wine per week  . Drug Use: No  . Sexual Activity: None   Other Topics Concern  . None   Social History Narrative    Current outpatient prescriptions: budesonide (PULMICORT FLEXHALER) 180 MCG/ACT inhaler, Inhale 1 puff into the lungs 2 (two) times daily., Disp: 3 each, Rfl: 3;  buPROPion (WELLBUTRIN SR) 100 MG 12 hr tablet, TAKE 2 TABLETS BY MOUTH EVERY MORNING, Disp: 200 tablet, Rfl: 2;  fluticasone (FLONASE) 50 MCG/ACT nasal spray, Place 1 spray into both nostrils daily., Disp: 32 g, Rfl: 6;  Phenazopyridine HCl (AZO TABS PO), Take by mouth., Disp: , Rfl:  ciprofloxacin (CIPRO) 250 MG tablet, Take 1 tablet (250 mg total) by mouth 2 (two) times daily., Disp: 10 tablet, Rfl: 0  EXAM:  Filed Vitals:   07/22/14 1415  BP: 102/62  Pulse: 64  Temp: 98.3 F (36.8 C)    Body mass index is 24.87 kg/(m^2).  GENERAL: vitals reviewed and listed above, alert, oriented, appears well hydrated and in no acute distress  HEENT: atraumatic, conjunttiva clear, no obvious abnormalities on inspection of external nose and ears  NECK: no obvious masses on inspection  LUNGS: clear  to auscultation bilaterally, no wheezes, rales or rhonchi, good air movement  CV: HRRR, no peripheral edema  ABD: no CVA TTP  MS: moves all extremities without noticeable abnormality  PSYCH: pleasant and cooperative, no obvious depression or anxiety  ASSESSMENT AND PLAN:  Discussed the following assessment and plan:  Dysuria - Plan: POC Urinalysis Dipstick  Persistent cough - Plan: DG Chest 2 View  -udip c/w uti, tx with cipro/risks discussed -cxr and follow up with PCP in 3-4 weeks if cough persists though likely mild residual post viral cough and after discussion opted given symptoms mild and improving to not change tx unless persists or CXR abnormal -Patient advised to return or notify a doctor immediately if symptoms worsen or persist or new concerns arise.  Patient Instructions  -As we discussed, we have prescribed a new medication for you at this appointment. We discussed the common and serious potential adverse effects of this medication and you can review these and more with the pharmacist when you pick up your medication.  Please follow the instructions for use carefully and notify us immediately if you have any problems taking this medication.  -get chest xray  -follow up with PCP in 3-4 weeks about asthma     KIM, HANNAH R.

## 2014-07-24 LAB — URINE CULTURE: Colony Count: >=100000

## 2014-09-27 ENCOUNTER — Other Ambulatory Visit: Payer: Self-pay

## 2014-09-27 DIAGNOSIS — Z1231 Encounter for screening mammogram for malignant neoplasm of breast: Secondary | ICD-10-CM

## 2014-10-11 ENCOUNTER — Ambulatory Visit
Admission: RE | Admit: 2014-10-11 | Discharge: 2014-10-11 | Disposition: A | Discharge status: Home / Self Care | Payer: 59 | Source: Ambulatory Visit

## 2014-10-11 DIAGNOSIS — Z1231 Encounter for screening mammogram for malignant neoplasm of breast: Secondary | ICD-10-CM

## 2014-12-15 ENCOUNTER — Other Ambulatory Visit: Payer: Self-pay | Admitting: Obstetrics and Gynecology

## 2014-12-16 LAB — CYTOLOGY - PAP

## 2015-01-31 ENCOUNTER — Other Ambulatory Visit: Payer: Self-pay | Admitting: Family Medicine

## 2015-03-15 ENCOUNTER — Ambulatory Visit (INDEPENDENT_AMBULATORY_CARE_PROVIDER_SITE_OTHER): Payer: 59 | Admitting: Family Medicine

## 2015-03-15 ENCOUNTER — Encounter: Payer: Self-pay | Admitting: Family Medicine

## 2015-03-15 VITALS — BP 102/76 | HR 77 | Temp 98.7°F | Ht 69.0 in

## 2015-03-15 DIAGNOSIS — J329 Chronic sinusitis, unspecified: Secondary | ICD-10-CM | POA: Diagnosis not present

## 2015-03-15 DIAGNOSIS — J4520 Mild intermittent asthma, uncomplicated: Secondary | ICD-10-CM | POA: Diagnosis not present

## 2015-03-15 DIAGNOSIS — J310 Chronic rhinitis: Secondary | ICD-10-CM

## 2015-03-15 MED ORDER — BUDESONIDE 180 MCG/ACT IN AEPB
1.0000 | INHALATION_SPRAY | Freq: Two times a day (BID) | RESPIRATORY_TRACT | Status: DC
Start: 2015-03-15 — End: 2021-06-08

## 2015-03-15 NOTE — Progress Notes (Signed)
Pre visit review using our clinic review tool, if applicable. No additional management support is needed unless otherwise documented below in the visit note. 

## 2015-03-15 NOTE — Progress Notes (Signed)
   HPI:  URI -started: yesterday -symptoms:nasal congestion, sore throat, cough  -denies:fever, SOB, NVD, tooth pain, wheezing -has tried: albuterol has not helped, taking flonase and claritin -sick contacts/travel/risks: denies flu exposure, tick exposure or or Ebola risks -Hx of: allergies, hx of mild allergy - usually take pulmicort but ran out of this and wants to try generic budesonide -delsum  ROS: See pertinent positives and negatives per HPI.  No past medical history on file.  No past surgical history on file.  No family history on file.  History   Social History  . Marital Status: Married    Spouse Name: N/A  . Number of Children: N/A  . Years of Education: N/A   Social History Main Topics  . Smoking status: Never Smoker   . Smokeless tobacco: Not on file  . Alcohol Use: 1.8 oz/week    3 Glasses of wine per week  . Drug Use: No  . Sexual Activity: Not on file   Other Topics Concern  . None   Social History Narrative     Current outpatient prescriptions:  .  buPROPion (WELLBUTRIN SR) 100 MG 12 hr tablet, TAKE 2 TABLETS BY MOUTH EVERY MORNING, Disp: 200 tablet, Rfl: 1 .  fluticasone (FLONASE) 50 MCG/ACT nasal spray, Place 1 spray into both nostrils daily., Disp: 32 g, Rfl: 6 .  budesonide (PULMICORT) 180 MCG/ACT inhaler, Inhale 1 puff into the lungs 2 (two) times daily., Disp: 1 Inhaler, Rfl: 2  EXAM:  Filed Vitals:   03/15/15 1549  BP: 102/76  Pulse: 77  Temp: 98.7 F (37.1 C)    There is no weight on file to calculate BMI.  GENERAL: vitals reviewed and listed above, alert, oriented, appears well hydrated and in no acute distress  HEENT: atraumatic, conjunttiva clear, no obvious abnormalities on inspection of external nose and ears, normal appearance of ear canals and TMs, clear nasal congestion, mild post oropharyngeal erythema with PND, no tonsillar edema or exudate, no sinus TTP  NECK: no obvious masses on inspection  LUNGS: clear to  auscultation bilaterally, no wheezes, rales or rhonchi, good air movement  CV: HRRR, no peripheral edema  MS: moves all extremities without noticeable abnormality  PSYCH: pleasant and cooperative, no obvious depression or anxiety  ASSESSMENT AND PLAN:  Discussed the following assessment and plan:  Rhinosinusitis  Asthma, mild intermittent, uncomplicated  -given HPI and exam findings today, a serious infection or illness is unlikely. We discussed potential etiologies, with VURI being most likely, and advised supportive care and monitoring. She is not having symptoms or findings today that suggest abacterial illness and no sig asthma symptoms other then cough. We discussed treatment side effects, likely course, antibiotic misuse, transmission, and signs of developing a serious illness. -will restart her budesonide and rx sent -of course, we advised to return or notify a doctor immediately if symptoms worsen or persist or new concerns arise.    There are no Patient Instructions on file for this visit.   Kriste BasqueKIM, HANNAH R.

## 2015-06-20 ENCOUNTER — Ambulatory Visit: Payer: 59 | Admitting: Sports Medicine

## 2015-06-23 ENCOUNTER — Encounter: Payer: Self-pay | Admitting: Sports Medicine

## 2015-06-23 ENCOUNTER — Ambulatory Visit (INDEPENDENT_AMBULATORY_CARE_PROVIDER_SITE_OTHER): Payer: 59 | Admitting: Sports Medicine

## 2015-06-23 VITALS — BP 122/67 | HR 68 | Ht 68.5 in | Wt 160.0 lb

## 2015-06-23 DIAGNOSIS — M216X9 Other acquired deformities of unspecified foot: Secondary | ICD-10-CM

## 2015-06-23 DIAGNOSIS — Q667 Congenital pes cavus, unspecified foot: Secondary | ICD-10-CM | POA: Insufficient documentation

## 2015-06-23 DIAGNOSIS — M722 Plantar fascial fibromatosis: Secondary | ICD-10-CM

## 2015-06-23 NOTE — Assessment & Plan Note (Signed)
This has been treated correctly without response  Suspect will need custom orthotics  Sports insoles as temp test

## 2015-06-23 NOTE — Progress Notes (Signed)
   Subjective:    Patient ID: Carol Branch, female    DOB: 07/27/1961, 54 y.o.   MRN: [id number]  HPI  54 y/o female who presents with a 6 month history of left sided plantar fasciitis.  She has previously been seen by [facility].  They gave her an injection, which she claims did not help.  Otherwise, she has been doing exercises, stretching and icing correctly but without relief.  She has intense pain in her heel when she first walks on it in the morning.  The pain decreases as she walks on it more, but it does not usually completely resolve.  She has mild pain in her right heel as well, but does not bother her as much as her left heel.  She has a history of hallux rigidus and has had surgery on her left 1st MTP for this previously. (Dr Lovie CholBenarz)  She reports improved pain at 1st MTP.      Review of Systems MSK: +heel pain, denies swelling Neuro: denies numbness, paresthesias    Objective:   Physical Exam  Constitutional: She is oriented to person, place, and time. She appears well-developed and well-nourished.  Neurological: She is alert and oriented to person, place, and time.  Skin: Skin is warm and dry.  Psychiatric: She has a normal mood and affect. Her behavior is normal. Judgment and thought content normal.  MSK: Foot/Ankle- Normal alignment of ankle.  Has pes cavus of bilateral feet.   Strong varus curve of RT foot. Left 1st MTP with squaring of MTP-tarsal joint.  Right 1st MTP-tarsal joint is varus.  Has high arches with standing as well.  Supinates while walking   -Palpation- +TTP left medial plantar calcaneus over os calcis.  Mild TTP over plantar fascia diffusely.    -ROM of ankles WNL, some loss of extension and flexion of left 1st MTP, right 1st MTP joint with normal ROM   -No instability of bilateral ankles, full muscle strength throughout   -Neurovasc- sensation and distal pulses intact   Ultrasound of left plantar foot- demonstrates thickening of left plantar  fascia to 0.84 cm.  Calcification seen on plantar fascia near os calcis, as well as, edema present in that area.  Os calcis does not appear to be injured.  Mild increased blood flow through color doppler near os calcis.  Right plantar foot showed plantar fascia thickened to 0.70 cm.  Os calcis not edematous on right.          Assessment & Plan:  Plantar fasciitis- Worsening, likely pulling on os calcis.  Gave patient arch pads bilaterally for pes cavus and gave standard sports insoles with heel lifts, left foot with hole in center of os calcis.  No edema of os calcis, but did have edema of plantar fascia on left with hypertrophy bilaterally.   -Recommend continuing ice at night. -Gave handout with exercises, which include walking on toes and walking backwards for 10 steps.  Recommend heel lifts on steps.  Recommend stretching plantar fascia while sitting, but not with heel against floor, as this will put strain on os calcis.   -Follow up 1 month to assess if custom made orthotics are needed or if modifications to current orthotics are needed.

## 2015-06-23 NOTE — Assessment & Plan Note (Signed)
Cochrane cites gold level EBM for improvement with custom orthotics for this foot shape  We will plan that for future

## 2015-06-25 ENCOUNTER — Other Ambulatory Visit: Payer: Self-pay | Admitting: Family Medicine

## 2015-07-26 ENCOUNTER — Ambulatory Visit: Payer: 59 | Admitting: Sports Medicine

## 2016-01-23 ENCOUNTER — Other Ambulatory Visit: Payer: Self-pay | Admitting: Obstetrics and Gynecology

## 2016-01-30 LAB — CYTOLOGY - PAP

## 2016-07-04 ENCOUNTER — Other Ambulatory Visit (INDEPENDENT_AMBULATORY_CARE_PROVIDER_SITE_OTHER): Payer: Self-pay | Admitting: Orthopaedic Surgery

## 2016-07-06 ENCOUNTER — Telehealth (INDEPENDENT_AMBULATORY_CARE_PROVIDER_SITE_OTHER): Payer: Self-pay

## 2016-07-06 NOTE — Telephone Encounter (Signed)
Please advise 

## 2016-07-06 NOTE — Telephone Encounter (Signed)
Called into pharmacy

## 2016-07-09 NOTE — Telephone Encounter (Signed)
Opened in error

## 2016-09-13 ENCOUNTER — Other Ambulatory Visit: Payer: Self-pay | Admitting: Obstetrics and Gynecology

## 2016-09-13 DIAGNOSIS — Z1231 Encounter for screening mammogram for malignant neoplasm of breast: Secondary | ICD-10-CM

## 2016-10-09 ENCOUNTER — Ambulatory Visit
Admission: RE | Admit: 2016-10-09 | Discharge: 2016-10-09 | Disposition: A | Discharge status: Home / Self Care | Payer: 59 | Source: Ambulatory Visit | Attending: Obstetrics and Gynecology | Admitting: Obstetrics and Gynecology

## 2016-10-09 DIAGNOSIS — Z1231 Encounter for screening mammogram for malignant neoplasm of breast: Secondary | ICD-10-CM

## 2016-11-06 ENCOUNTER — Other Ambulatory Visit (INDEPENDENT_AMBULATORY_CARE_PROVIDER_SITE_OTHER): Payer: Self-pay | Admitting: Orthopaedic Surgery

## 2016-11-06 NOTE — Telephone Encounter (Signed)
Called Tramadol into her pharmacy

## 2016-11-06 NOTE — Telephone Encounter (Signed)
Please advise 

## 2017-03-20 ENCOUNTER — Other Ambulatory Visit (INDEPENDENT_AMBULATORY_CARE_PROVIDER_SITE_OTHER): Payer: Self-pay | Admitting: Orthopaedic Surgery

## 2017-03-20 NOTE — Telephone Encounter (Signed)
Please advise 

## 2017-03-20 NOTE — Telephone Encounter (Signed)
Called into pharmacy

## 2017-05-06 ENCOUNTER — Ambulatory Visit (INDEPENDENT_AMBULATORY_CARE_PROVIDER_SITE_OTHER): Payer: BLUE CROSS/BLUE SHIELD | Admitting: Orthopaedic Surgery

## 2017-05-06 ENCOUNTER — Encounter (INDEPENDENT_AMBULATORY_CARE_PROVIDER_SITE_OTHER): Payer: Self-pay | Admitting: Orthopaedic Surgery

## 2017-05-06 DIAGNOSIS — M25552 Pain in left hip: Secondary | ICD-10-CM | POA: Insufficient documentation

## 2017-05-06 DIAGNOSIS — M25551 Pain in right hip: Secondary | ICD-10-CM | POA: Diagnosis not present

## 2017-05-06 MED ORDER — METHOCARBAMOL 500 MG PO TABS: 500.0000 mg | ORAL_TABLET | Freq: Four times a day (QID) | ORAL | 3 refills | Status: DC | PRN

## 2017-05-06 MED ORDER — CELECOXIB 200 MG PO CAPS: 200.0000 mg | ORAL_CAPSULE | Freq: Two times a day (BID) | ORAL | 6 refills | Status: DC | PRN

## 2017-05-06 NOTE — Progress Notes (Signed)
Office Visit Note   Patient: Carol Branch           Date of Birth: 1961-05-09           MRN: [id number] Visit Date: 05/06/2017              Requested by: Merri Brunette, MD [phone] [address] [place], [place] [address] PCP: Merri Brunette, MD   Assessment & Plan: Visit Diagnoses:  1. Pain in left hip   2. Pain of right hip joint     Plan: I did give her prescription for physical therapy that she is doing [facility]. I showed her stretching exercises I will order try regular basis at least twice daily. She demonstrated these back to me. I will send in more Robaxin as well as prescribe Celebrex for her. All questions were encouraged and answered.  Follow-Up Instructions: Return if symptoms worsen or fail to improve.   Orders:  No orders of the defined types were placed in this encounter.  Meds ordered this encounter  Medications  . methocarbamol (ROBAXIN) 500 MG tablet    Sig: Take 1 tablet (500 mg total) by mouth every 6 (six) hours as needed for muscle spasms.    Dispense:  60 tablet    Refill:  3  . celecoxib (CELEBREX) 200 MG capsule    Sig: Take 1 capsule (200 mg total) by mouth 2 (two) times daily between meals as needed.    Dispense:  60 capsule    Refill:  6      Procedures: No procedures performed   Clinical Data: No additional findings.   Subjective: Chief Complaint  Patient presents with  . Left Hip - Pain  . Right Hip - Pain  The patient is someone that I have not seen in a while but her chief complaints the same of bilateral hip pain. We have Agnes trochanteric bursitis in the past. She hurts on the lateral aspect of both her hips and denies any groin pain. She would like to consider is in therapy at this point. Her right hip hurts the same as her left hip. She's been waking with pain. She does a lot of driving between the [address] and [place]. She denies any knee pain. She has any back issues. She still does not  feel that she needs any injections. She would like to refill her methocarbamol and try Celebrex as well.  HPI  Review of Systems She currently denies any headache, chest pain, fever, chills, nausea, vomiting.  Objective: Vital Signs: There were no vitals taken for this visit.  Physical Exam He is alert and oriented 3 and in no acute distress Ortho Exam Examination of both hips show fluid internal/external rotation with only pain over trochanteric area on both sides. Her ligaments are equal. Her knee exam is normal. Specialty Comments:  No specialty comments available.  Imaging: No results found. X-rays in the past showed normal-appearing hips with no cementing arthritic changes. PMFS History: Patient Active Problem List   Diagnosis Date Noted  . Pain in left hip 05/06/2017  . Pain of right hip joint 05/06/2017  . Plantar fasciitis, bilateral 06/23/2015  . 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 Cervical back pain with evidence of disc disease 12/24/2012  . Depression 10/09/2012  . 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 VIRAL URI 10/27/2008  . Allergic rhinitis 06/06/2007  . Asthma 06/06/2007   No past medical history on file.  No family history on  file.  No past surgical history on file. Social History   Occupational History  . Not on file.   Social History Main Topics  . Smoking status: Never Smoker  . Smokeless tobacco: Never Used  . Alcohol use 1.8 oz/week    3 Glasses of wine per week  . Drug use: No  . Sexual activity: Not on file

## 2017-05-21 ENCOUNTER — Other Ambulatory Visit (INDEPENDENT_AMBULATORY_CARE_PROVIDER_SITE_OTHER): Payer: Self-pay

## 2017-05-21 ENCOUNTER — Telehealth (INDEPENDENT_AMBULATORY_CARE_PROVIDER_SITE_OTHER): Payer: Self-pay | Admitting: Radiology

## 2017-05-21 MED ORDER — METHOCARBAMOL 750 MG PO TABS: 750.0000 mg | ORAL_TABLET | Freq: Three times a day (TID) | ORAL | 1 refills | Status: DC | PRN

## 2017-05-21 NOTE — Telephone Encounter (Signed)
Please advise? Or change her medicine?

## 2017-05-21 NOTE — Progress Notes (Unsigned)
robaxin 

## 2017-05-21 NOTE — Telephone Encounter (Signed)
It is fine to change her Robaxin to the 750 mg amount

## 2017-05-21 NOTE — Telephone Encounter (Signed)
Called 750 into pharmacy

## 2017-05-21 NOTE — Telephone Encounter (Signed)
Patient called stating the pharmacy does not have the Robaxin 500mg  that was prescribed to her last week, however they do have the 750mg .  She would like to know if her prescription can be changed to that?  She would also like a refill on her tramadol.  [facility] uses [facility] [address].

## 2017-07-02 ENCOUNTER — Other Ambulatory Visit (INDEPENDENT_AMBULATORY_CARE_PROVIDER_SITE_OTHER): Payer: Self-pay | Admitting: Orthopaedic Surgery

## 2017-07-02 NOTE — Telephone Encounter (Signed)
Please advise 

## 2017-07-10 ENCOUNTER — Other Ambulatory Visit (INDEPENDENT_AMBULATORY_CARE_PROVIDER_SITE_OTHER): Payer: Self-pay

## 2017-07-10 MED ORDER — METHOCARBAMOL 750 MG PO TABS: 750.0000 mg | ORAL_TABLET | Freq: Three times a day (TID) | ORAL | 1 refills | Status: DC | PRN

## 2018-01-08 ENCOUNTER — Other Ambulatory Visit (INDEPENDENT_AMBULATORY_CARE_PROVIDER_SITE_OTHER): Payer: Self-pay | Admitting: Orthopaedic Surgery

## 2018-01-08 NOTE — Telephone Encounter (Signed)
Please advise 

## 2018-01-09 ENCOUNTER — Other Ambulatory Visit (INDEPENDENT_AMBULATORY_CARE_PROVIDER_SITE_OTHER): Payer: Self-pay | Admitting: Orthopaedic Surgery

## 2018-01-09 NOTE — Telephone Encounter (Signed)
Please advise 

## 2020-12-23 DIAGNOSIS — N8110 Cystocele, unspecified: Secondary | ICD-10-CM | POA: Diagnosis not present

## 2020-12-23 DIAGNOSIS — R350 Frequency of micturition: Secondary | ICD-10-CM | POA: Diagnosis not present

## 2021-01-11 DIAGNOSIS — N8110 Cystocele, unspecified: Secondary | ICD-10-CM | POA: Diagnosis not present

## 2021-02-22 DIAGNOSIS — M5030 Other cervical disc degeneration, unspecified cervical region: Secondary | ICD-10-CM | POA: Diagnosis not present

## 2021-02-22 DIAGNOSIS — Z6826 Body mass index (BMI) 26.0-26.9, adult: Secondary | ICD-10-CM | POA: Diagnosis not present

## 2021-02-22 DIAGNOSIS — K219 Gastro-esophageal reflux disease without esophagitis: Secondary | ICD-10-CM | POA: Diagnosis not present

## 2021-02-22 DIAGNOSIS — Z1231 Encounter for screening mammogram for malignant neoplasm of breast: Secondary | ICD-10-CM | POA: Diagnosis not present

## 2021-03-16 ENCOUNTER — Other Ambulatory Visit: Payer: Self-pay | Admitting: Family Medicine

## 2021-03-16 DIAGNOSIS — Z1231 Encounter for screening mammogram for malignant neoplasm of breast: Secondary | ICD-10-CM

## 2021-05-08 DIAGNOSIS — M25551 Pain in right hip: Secondary | ICD-10-CM | POA: Diagnosis not present

## 2021-05-09 ENCOUNTER — Ambulatory Visit
Admission: RE | Admit: 2021-05-09 | Discharge: 2021-05-09 | Disposition: A | Discharge status: Home / Self Care | Payer: BC Managed Care – PPO | Source: Ambulatory Visit | Attending: Family Medicine | Admitting: Family Medicine

## 2021-05-09 ENCOUNTER — Other Ambulatory Visit: Payer: Self-pay

## 2021-05-09 DIAGNOSIS — Z1231 Encounter for screening mammogram for malignant neoplasm of breast: Secondary | ICD-10-CM

## 2021-05-09 DIAGNOSIS — H524 Presbyopia: Secondary | ICD-10-CM | POA: Diagnosis not present

## 2021-05-09 DIAGNOSIS — H5211 Myopia, right eye: Secondary | ICD-10-CM | POA: Diagnosis not present

## 2021-05-29 DIAGNOSIS — Z86010 Personal history of colonic polyps: Secondary | ICD-10-CM | POA: Diagnosis not present

## 2021-05-29 DIAGNOSIS — Z79899 Other long term (current) drug therapy: Secondary | ICD-10-CM | POA: Diagnosis not present

## 2021-05-29 DIAGNOSIS — K219 Gastro-esophageal reflux disease without esophagitis: Secondary | ICD-10-CM | POA: Diagnosis not present

## 2021-05-29 DIAGNOSIS — Z1211 Encounter for screening for malignant neoplasm of colon: Secondary | ICD-10-CM | POA: Diagnosis not present

## 2021-06-08 ENCOUNTER — Other Ambulatory Visit: Payer: Self-pay

## 2021-06-08 ENCOUNTER — Ambulatory Visit: Payer: BC Managed Care – PPO | Admitting: Physician Assistant

## 2021-06-08 ENCOUNTER — Encounter: Payer: Self-pay | Admitting: Physician Assistant

## 2021-06-08 DIAGNOSIS — Z808 Family history of malignant neoplasm of other organs or systems: Secondary | ICD-10-CM

## 2021-06-08 DIAGNOSIS — Z20822 Contact with and (suspected) exposure to covid-19: Secondary | ICD-10-CM | POA: Diagnosis not present

## 2021-06-08 DIAGNOSIS — L309 Dermatitis, unspecified: Secondary | ICD-10-CM

## 2021-06-08 DIAGNOSIS — Z1283 Encounter for screening for malignant neoplasm of skin: Secondary | ICD-10-CM | POA: Diagnosis not present

## 2021-06-08 MED ORDER — TRIAMCINOLONE ACETONIDE 0.1 % EX CREA: 1.0000 "application " | TOPICAL_CREAM | Freq: Every day | CUTANEOUS | 2 refills | Status: AC

## 2021-06-08 NOTE — Patient Instructions (Signed)
Seborrheic Keratosis A seborrheic keratosis is a common, noncancerous (benign) skin growth. These growths are velvety, waxy, rough, tan, brown, or black spots that appear on the skin. These skin growths can be flat or raised, andscaly. What are the causes? The cause of this condition is not known. What increases the risk? You are more likely to develop this condition if you: Have a family history of seborrheic keratosis. Are 50 or older. Are pregnant. Have had estrogen replacement therapy. What are the signs or symptoms? Symptoms of this condition include growths on the face, chest, shoulders, back, or other areas. These growths: Are usually painless, but may become irritated and itchy. Can be yellow, brown, black, or other colors. Are slightly raised or have a flat surface. Are sometimes rough or wart-like in texture. Are often velvety or waxy on the surface. Are round or oval-shaped. Often occur in groups, but may occur as a single growth. How is this diagnosed? This condition is diagnosed with a medical history and physical exam. A sample of the growth may be tested (skin biopsy). You may need to see a skin specialist (dermatologist). How is this treated? Treatment is not usually needed for this condition, unless the growths are irritated or bleed often. You may also choose to have the growths removed if you do not like their appearance. Most commonly, these growths are treated with a procedure in which liquid nitrogen is applied to "freeze" off the growth (cryosurgery). They may also be burned off with electricity (electrocautery) or removed by scraping (curettage). Follow these instructions at home: Watch your growth for any changes. Keep all follow-up visits as told by your health care provider. This is important. Do not scratch or pick at the growth or growths. This can cause them to become irritated or infected. Contact a health care provider if: You suddenly have many new  growths. Your growth bleeds, itches, or hurts. Your growth suddenly becomes larger or changes color. Summary A seborrheic keratosis is a common, noncancerous (benign) skin growth. Treatment is not usually needed for this condition, unless the growths are irritated or bleed often. Watch your growth for any changes. Contact a health care provider if you suddenly have many new growths or your growth suddenly becomes larger or changes color. Keep all follow-up visits as told by your health care provider. This is important. This information is not intended to replace advice given to you by your health care provider. Make sure you discuss any questions you have with your healthcare provider. Document Revised: 01/09/2018 Document Reviewed: 01/09/2018 Elsevier Patient Education  2022 [employer]  

## 2021-06-12 ENCOUNTER — Encounter: Payer: Self-pay | Admitting: Physician Assistant

## 2021-06-12 NOTE — Progress Notes (Signed)
   New Patient   Subjective  Carol Branch is a 60 y.o. female who presents for the following: New Patient (Initial Visit) (Patient here today for skin check no concerns. No personal history of atypical mole, melanoma or non mole skin cancer. Per patient her father has a history of melanoma and non mole skin cancer. ). She has a rash on  her right arm x months. Persistent and pruritic in nature.    The following portions of the chart were reviewed this encounter and updated as appropriate:  Tobacco  Allergies  Meds  Problems  Med Hx  Surg Hx  Fam Hx      Objective  Well appearing patient in no apparent distress; mood and affect are within normal limits.  A full examination was performed including scalp, head, eyes, ears, nose, lips, neck, chest, axillae, abdomen, back, buttocks, bilateral upper extremities, bilateral lower extremities, hands, feet, fingers, toes, fingernails, and toenails. All findings within normal limits unless otherwise noted below.  Head to Toe No atypical nevi or signs of NMSC noted at the time of the visit.   Right Upper Arm - Posterior Ill-defined pink patches with scale-crust.    Assessment & Plan  Skin exam for malignant neoplasm Head to Toe  Yearly skin check  Eczema, unspecified type Right Upper Arm - Posterior  triamcinolone cream (KENALOG) 0.1 % - Right Upper Arm - Posterior Apply 1 application topically daily.     I, Keyontay Stolz, PA-C, have reviewed all documentation's for this visit.  The documentation on 06/12/21 for the exam, diagnosis, procedures and orders are all accurate and complete.

## 2021-06-14 DIAGNOSIS — Z01818 Encounter for other preprocedural examination: Secondary | ICD-10-CM | POA: Diagnosis not present

## 2021-06-20 DIAGNOSIS — Z01812 Encounter for preprocedural laboratory examination: Secondary | ICD-10-CM | POA: Diagnosis not present

## 2021-06-23 DIAGNOSIS — K219 Gastro-esophageal reflux disease without esophagitis: Secondary | ICD-10-CM | POA: Diagnosis not present

## 2021-06-23 DIAGNOSIS — K449 Diaphragmatic hernia without obstruction or gangrene: Secondary | ICD-10-CM | POA: Diagnosis not present

## 2021-06-23 DIAGNOSIS — Z1211 Encounter for screening for malignant neoplasm of colon: Secondary | ICD-10-CM | POA: Diagnosis not present

## 2021-06-23 DIAGNOSIS — K2100 Gastro-esophageal reflux disease with esophagitis, without bleeding: Secondary | ICD-10-CM | POA: Diagnosis not present

## 2021-06-23 DIAGNOSIS — D123 Benign neoplasm of transverse colon: Secondary | ICD-10-CM | POA: Diagnosis not present

## 2021-06-23 DIAGNOSIS — K635 Polyp of colon: Secondary | ICD-10-CM | POA: Diagnosis not present

## 2021-06-30 DIAGNOSIS — Z20822 Contact with and (suspected) exposure to covid-19: Secondary | ICD-10-CM | POA: Diagnosis not present

## 2021-06-30 DIAGNOSIS — J45909 Unspecified asthma, uncomplicated: Secondary | ICD-10-CM | POA: Diagnosis not present

## 2021-07-01 DIAGNOSIS — Z20822 Contact with and (suspected) exposure to covid-19: Secondary | ICD-10-CM | POA: Diagnosis not present

## 2021-07-14 DIAGNOSIS — D123 Benign neoplasm of transverse colon: Secondary | ICD-10-CM | POA: Diagnosis not present

## 2021-07-14 DIAGNOSIS — K2100 Gastro-esophageal reflux disease with esophagitis, without bleeding: Secondary | ICD-10-CM | POA: Diagnosis not present

## 2021-07-14 DIAGNOSIS — K648 Other hemorrhoids: Secondary | ICD-10-CM | POA: Diagnosis not present

## 2021-07-14 DIAGNOSIS — K5730 Diverticulosis of large intestine without perforation or abscess without bleeding: Secondary | ICD-10-CM | POA: Diagnosis not present

## 2021-09-19 DIAGNOSIS — Z01812 Encounter for preprocedural laboratory examination: Secondary | ICD-10-CM | POA: Diagnosis not present

## 2021-09-21 DIAGNOSIS — Z01812 Encounter for preprocedural laboratory examination: Secondary | ICD-10-CM | POA: Diagnosis not present

## 2021-12-05 DIAGNOSIS — M542 Cervicalgia: Secondary | ICD-10-CM | POA: Diagnosis not present

## 2021-12-18 DIAGNOSIS — M542 Cervicalgia: Secondary | ICD-10-CM | POA: Diagnosis not present

## 2021-12-25 DIAGNOSIS — M542 Cervicalgia: Secondary | ICD-10-CM | POA: Diagnosis not present

## 2022-02-21 DIAGNOSIS — B009 Herpesviral infection, unspecified: Secondary | ICD-10-CM | POA: Diagnosis not present

## 2022-02-21 DIAGNOSIS — M5030 Other cervical disc degeneration, unspecified cervical region: Secondary | ICD-10-CM | POA: Diagnosis not present

## 2022-02-21 DIAGNOSIS — R945 Abnormal results of liver function studies: Secondary | ICD-10-CM | POA: Diagnosis not present

## 2022-02-21 DIAGNOSIS — Z0000 Encounter for general adult medical examination without abnormal findings: Secondary | ICD-10-CM | POA: Diagnosis not present

## 2022-02-21 DIAGNOSIS — Z01419 Encounter for gynecological examination (general) (routine) without abnormal findings: Secondary | ICD-10-CM | POA: Diagnosis not present

## 2022-02-21 DIAGNOSIS — R7303 Prediabetes: Secondary | ICD-10-CM | POA: Diagnosis not present

## 2022-02-21 DIAGNOSIS — R5383 Other fatigue: Secondary | ICD-10-CM | POA: Diagnosis not present

## 2022-06-04 ENCOUNTER — Other Ambulatory Visit: Payer: Self-pay | Admitting: Internal Medicine

## 2022-06-04 DIAGNOSIS — Z1231 Encounter for screening mammogram for malignant neoplasm of breast: Secondary | ICD-10-CM

## 2022-06-12 ENCOUNTER — Ambulatory Visit: Payer: BC Managed Care – PPO | Admitting: Physician Assistant

## 2022-06-26 DIAGNOSIS — J069 Acute upper respiratory infection, unspecified: Secondary | ICD-10-CM | POA: Diagnosis not present

## 2022-06-28 DIAGNOSIS — J988 Other specified respiratory disorders: Secondary | ICD-10-CM | POA: Diagnosis not present

## 2022-07-04 ENCOUNTER — Ambulatory Visit
Admission: RE | Admit: 2022-07-04 | Discharge: 2022-07-04 | Disposition: A | Discharge status: Home / Self Care | Payer: BC Managed Care – PPO | Source: Ambulatory Visit | Attending: Internal Medicine | Admitting: Internal Medicine

## 2022-07-04 DIAGNOSIS — Z1231 Encounter for screening mammogram for malignant neoplasm of breast: Secondary | ICD-10-CM | POA: Diagnosis not present

## 2022-08-07 DIAGNOSIS — H0012 Chalazion right lower eyelid: Secondary | ICD-10-CM | POA: Diagnosis not present

## 2022-08-13 DIAGNOSIS — K219 Gastro-esophageal reflux disease without esophagitis: Secondary | ICD-10-CM | POA: Diagnosis not present

## 2022-08-13 DIAGNOSIS — F32A Depression, unspecified: Secondary | ICD-10-CM | POA: Diagnosis not present

## 2022-08-13 DIAGNOSIS — E7800 Pure hypercholesterolemia, unspecified: Secondary | ICD-10-CM | POA: Diagnosis not present

## 2022-08-13 DIAGNOSIS — J4520 Mild intermittent asthma, uncomplicated: Secondary | ICD-10-CM | POA: Diagnosis not present

## 2022-10-15 DIAGNOSIS — M25532 Pain in left wrist: Secondary | ICD-10-CM | POA: Diagnosis not present

## 2022-10-15 DIAGNOSIS — M25562 Pain in left knee: Secondary | ICD-10-CM | POA: Diagnosis not present

## 2022-10-15 DIAGNOSIS — M25522 Pain in left elbow: Secondary | ICD-10-CM | POA: Diagnosis not present

## 2022-10-25 DIAGNOSIS — S52102A Unspecified fracture of upper end of left radius, initial encounter for closed fracture: Secondary | ICD-10-CM | POA: Diagnosis not present

## 2022-10-25 DIAGNOSIS — M13842 Other specified arthritis, left hand: Secondary | ICD-10-CM | POA: Diagnosis not present

## 2022-10-25 DIAGNOSIS — M79632 Pain in left forearm: Secondary | ICD-10-CM | POA: Diagnosis not present

## 2022-11-15 DIAGNOSIS — S52102A Unspecified fracture of upper end of left radius, initial encounter for closed fracture: Secondary | ICD-10-CM | POA: Diagnosis not present

## 2022-11-15 DIAGNOSIS — M13842 Other specified arthritis, left hand: Secondary | ICD-10-CM | POA: Diagnosis not present

## 2022-11-15 DIAGNOSIS — M25561 Pain in right knee: Secondary | ICD-10-CM | POA: Diagnosis not present

## 2022-11-15 DIAGNOSIS — M25562 Pain in left knee: Secondary | ICD-10-CM | POA: Diagnosis not present

## 2022-11-15 DIAGNOSIS — M79632 Pain in left forearm: Secondary | ICD-10-CM | POA: Diagnosis not present

## 2022-11-23 DIAGNOSIS — M25562 Pain in left knee: Secondary | ICD-10-CM | POA: Diagnosis not present

## 2022-11-23 DIAGNOSIS — M25561 Pain in right knee: Secondary | ICD-10-CM | POA: Diagnosis not present

## 2022-11-26 DIAGNOSIS — M542 Cervicalgia: Secondary | ICD-10-CM | POA: Diagnosis not present

## 2022-12-03 DIAGNOSIS — M542 Cervicalgia: Secondary | ICD-10-CM | POA: Diagnosis not present

## 2022-12-10 DIAGNOSIS — M542 Cervicalgia: Secondary | ICD-10-CM | POA: Diagnosis not present

## 2022-12-18 DIAGNOSIS — M542 Cervicalgia: Secondary | ICD-10-CM | POA: Diagnosis not present

## 2022-12-28 DIAGNOSIS — M542 Cervicalgia: Secondary | ICD-10-CM | POA: Diagnosis not present

## 2023-01-02 DIAGNOSIS — L814 Other melanin hyperpigmentation: Secondary | ICD-10-CM | POA: Diagnosis not present

## 2023-01-02 DIAGNOSIS — M542 Cervicalgia: Secondary | ICD-10-CM | POA: Diagnosis not present

## 2023-02-19 DIAGNOSIS — M542 Cervicalgia: Secondary | ICD-10-CM | POA: Diagnosis not present

## 2023-03-06 ENCOUNTER — Other Ambulatory Visit: Payer: Self-pay | Admitting: Neurosurgery

## 2023-03-06 DIAGNOSIS — M542 Cervicalgia: Secondary | ICD-10-CM

## 2023-03-25 IMAGING — MG DIGITAL SCREENING BREAST BILAT IMPLANT W/ TOMO W/ CAD
8 of 12 series · 8 of 28 positions shown · non-contrast
Comparison: Previous exam(s).

CLINICAL DATA: Screening.

EXAM:
DIGITAL SCREENING BILATERAL MAMMOGRAM WITH IMPLANTS, CAD AND
TOMOSYNTHESIS
TECHNIQUE: Bilateral screening digital craniocaudal and mediolateral oblique
mammograms were obtained. Bilateral screening digital breast
tomosynthesis was performed. The images were evaluated with
computer-aided detection. Standard and/or implant displaced views
were performed.

[R CC]
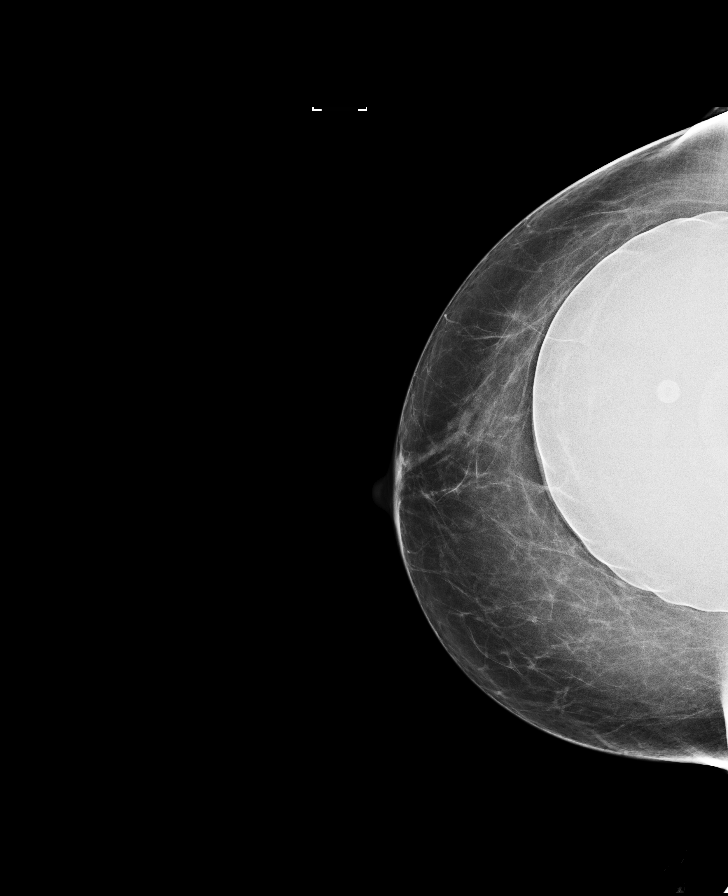

[L MLO]
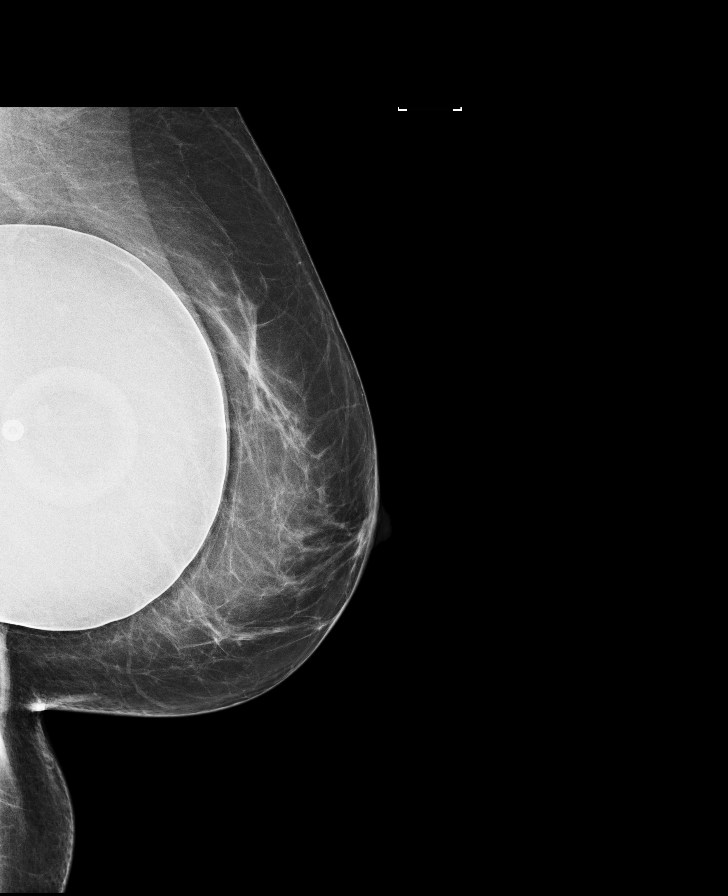

[R MLO]
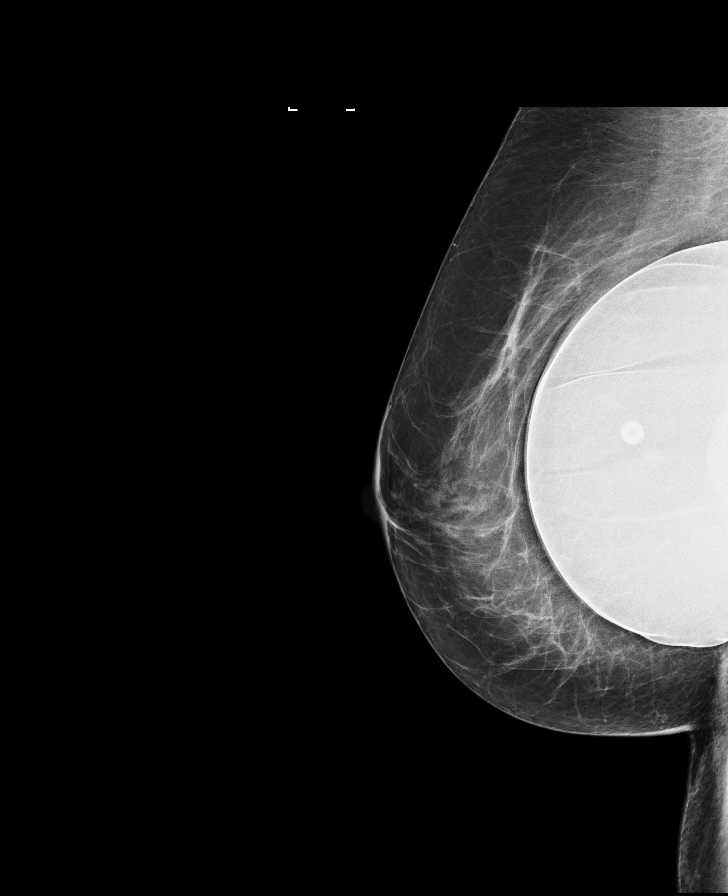

[L CC]
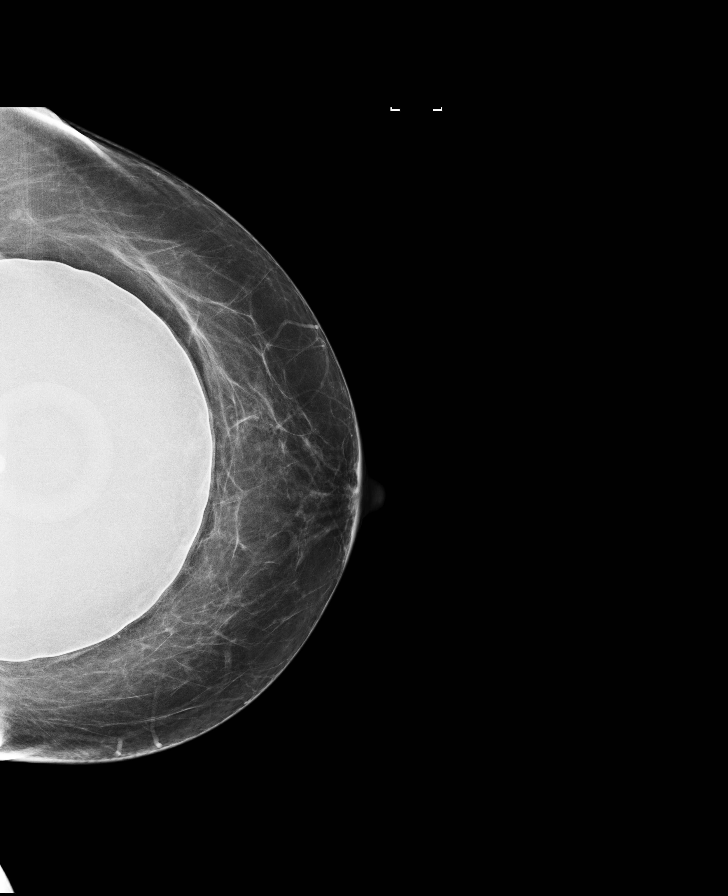

[R CC synth-2D]
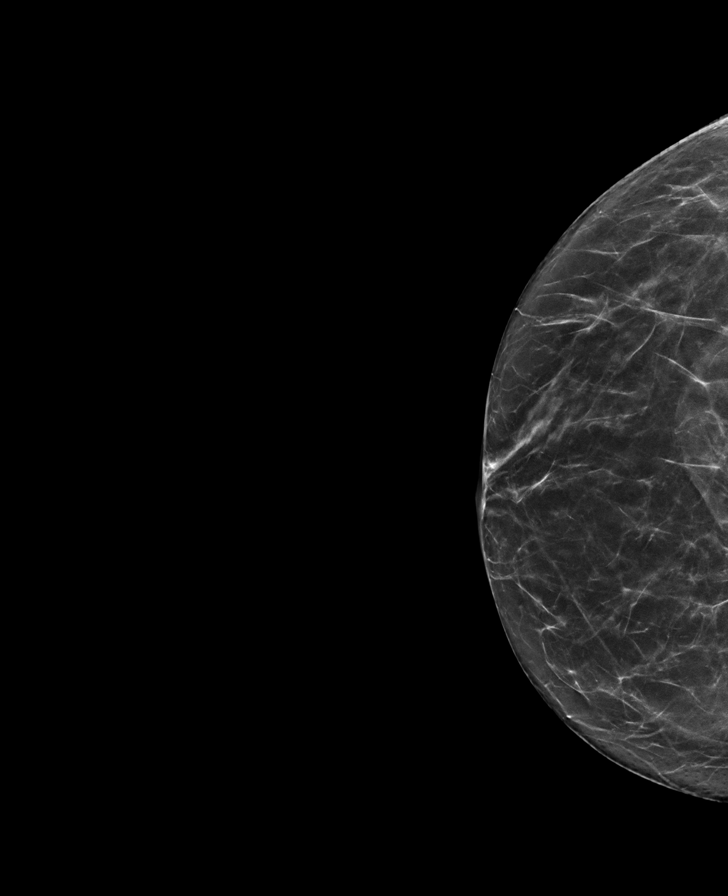

[L CC synth-2D]
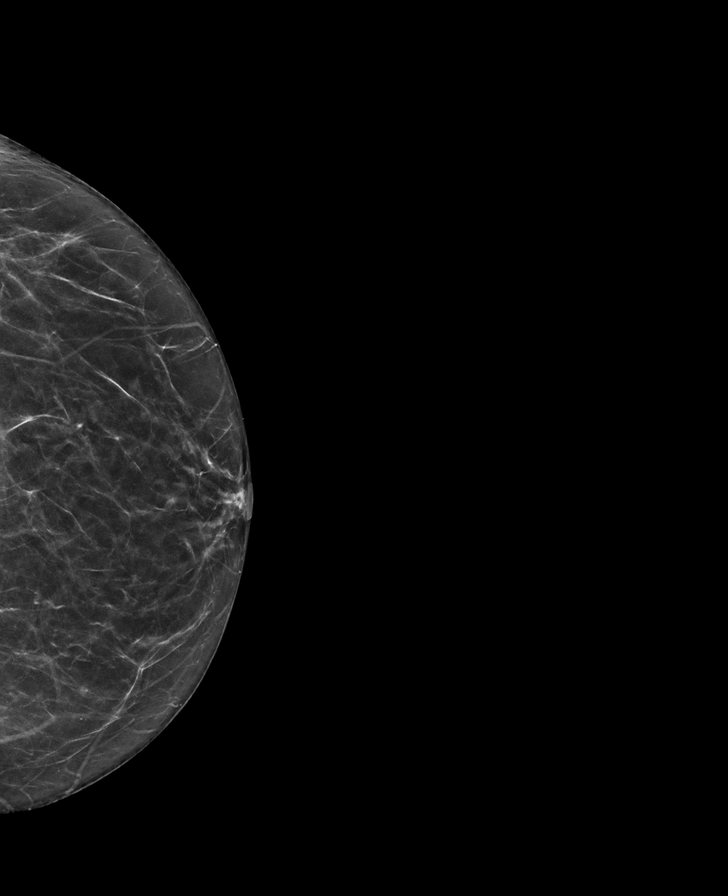

[R MLO synth-2D]
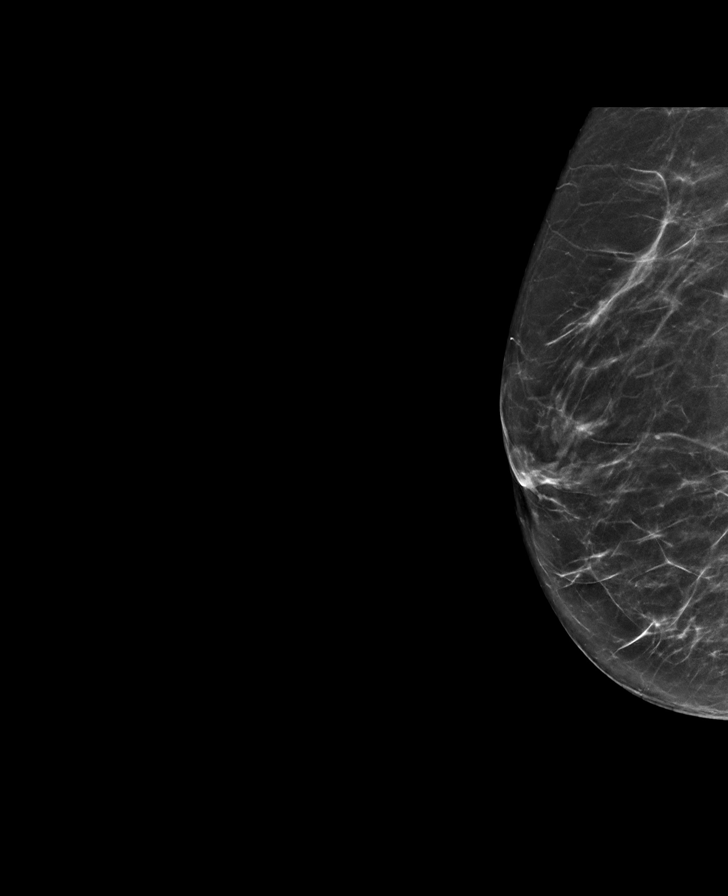

[L MLO synth-2D]
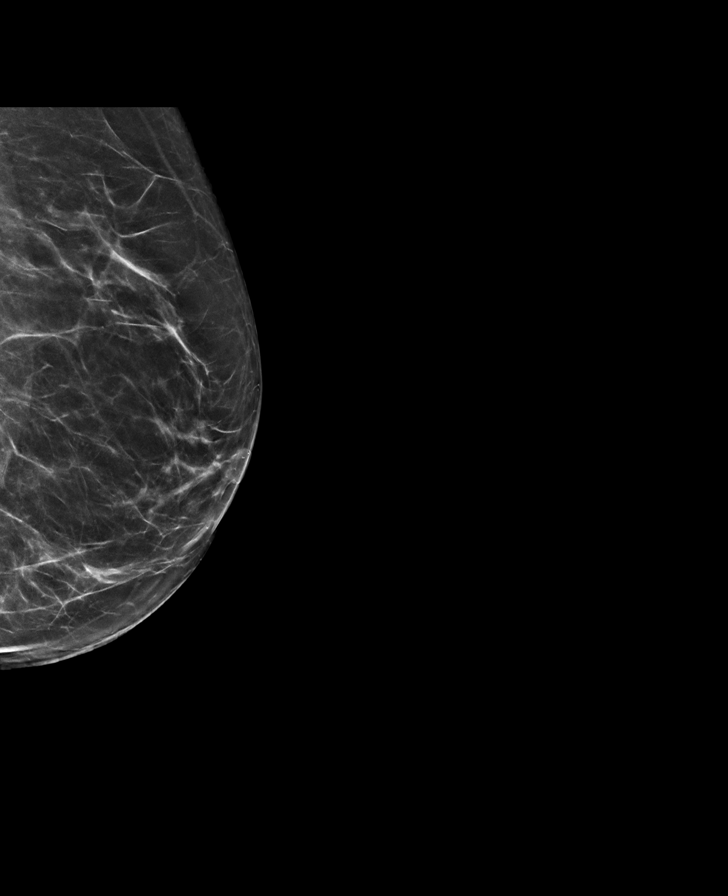

[8 of 28 positions shown; findings below may reference images not displayed]

ACR Breast Density Category b: There are scattered areas of
fibroglandular density.
FINDINGS: The patient has retropectoral implants. There are no findings
suspicious for malignancy.
IMPRESSION: No mammographic evidence of malignancy. A result letter of this
screening mammogram will be mailed directly to the patient.

RECOMMENDATION:
Screening mammogram in one year. (Code:SE-S-JMG)

BI-RADS CATEGORY  1:  Negative.

## 2023-03-26 DIAGNOSIS — F32A Depression, unspecified: Secondary | ICD-10-CM | POA: Diagnosis not present

## 2023-03-26 DIAGNOSIS — Z79899 Other long term (current) drug therapy: Secondary | ICD-10-CM | POA: Diagnosis not present

## 2023-03-26 DIAGNOSIS — E7800 Pure hypercholesterolemia, unspecified: Secondary | ICD-10-CM | POA: Diagnosis not present

## 2023-03-26 DIAGNOSIS — R748 Abnormal levels of other serum enzymes: Secondary | ICD-10-CM | POA: Diagnosis not present

## 2023-03-26 DIAGNOSIS — K219 Gastro-esophageal reflux disease without esophagitis: Secondary | ICD-10-CM | POA: Diagnosis not present

## 2023-03-26 DIAGNOSIS — Z0000 Encounter for general adult medical examination without abnormal findings: Secondary | ICD-10-CM | POA: Diagnosis not present

## 2023-05-06 ENCOUNTER — Encounter: Payer: Self-pay | Admitting: Neurosurgery

## 2023-05-16 ENCOUNTER — Other Ambulatory Visit: Payer: BC Managed Care – PPO

## 2023-06-17 ENCOUNTER — Other Ambulatory Visit: Payer: BC Managed Care – PPO

## 2023-07-06 ENCOUNTER — Ambulatory Visit
Admission: RE | Admit: 2023-07-06 | Discharge: 2023-07-06 | Disposition: A | Discharge status: Home / Self Care | Payer: BC Managed Care – PPO | Source: Ambulatory Visit | Attending: Neurosurgery | Admitting: Neurosurgery

## 2023-07-06 DIAGNOSIS — M542 Cervicalgia: Secondary | ICD-10-CM | POA: Diagnosis not present

## 2023-07-06 DIAGNOSIS — R202 Paresthesia of skin: Secondary | ICD-10-CM | POA: Diagnosis not present

## 2023-08-20 DIAGNOSIS — M542 Cervicalgia: Secondary | ICD-10-CM | POA: Diagnosis not present

## 2023-08-20 DIAGNOSIS — Z6827 Body mass index (BMI) 27.0-27.9, adult: Secondary | ICD-10-CM | POA: Diagnosis not present

## 2023-09-24 DIAGNOSIS — M170 Bilateral primary osteoarthritis of knee: Secondary | ICD-10-CM | POA: Diagnosis not present

## 2023-10-01 ENCOUNTER — Other Ambulatory Visit: Payer: Self-pay | Admitting: Internal Medicine

## 2023-10-01 DIAGNOSIS — Z1231 Encounter for screening mammogram for malignant neoplasm of breast: Secondary | ICD-10-CM

## 2023-10-03 ENCOUNTER — Ambulatory Visit
Admission: RE | Admit: 2023-10-03 | Discharge: 2023-10-03 | Disposition: A | Discharge status: Home / Self Care | Payer: BC Managed Care – PPO | Source: Ambulatory Visit | Attending: Internal Medicine | Admitting: Internal Medicine

## 2023-10-03 DIAGNOSIS — Z1231 Encounter for screening mammogram for malignant neoplasm of breast: Secondary | ICD-10-CM

## 2023-10-16 DIAGNOSIS — W19XXXA Unspecified fall, initial encounter: Secondary | ICD-10-CM | POA: Diagnosis not present

## 2023-10-16 DIAGNOSIS — M545 Low back pain, unspecified: Secondary | ICD-10-CM | POA: Diagnosis not present

## 2023-10-16 DIAGNOSIS — M533 Sacrococcygeal disorders, not elsewhere classified: Secondary | ICD-10-CM | POA: Diagnosis not present

## 2023-10-21 ENCOUNTER — Other Ambulatory Visit (HOSPITAL_COMMUNITY): Payer: Self-pay | Admitting: Internal Medicine

## 2023-10-21 ENCOUNTER — Ambulatory Visit (HOSPITAL_COMMUNITY)
Admission: RE | Admit: 2023-10-21 | Discharge: 2023-10-21 | Disposition: A | Discharge status: Home / Self Care | Payer: BC Managed Care – PPO | Source: Ambulatory Visit | Attending: Internal Medicine | Admitting: Internal Medicine

## 2023-10-21 DIAGNOSIS — M4856XA Collapsed vertebra, not elsewhere classified, lumbar region, initial encounter for fracture: Secondary | ICD-10-CM | POA: Diagnosis not present

## 2023-10-21 DIAGNOSIS — S32030A Wedge compression fracture of third lumbar vertebra, initial encounter for closed fracture: Secondary | ICD-10-CM | POA: Insufficient documentation

## 2023-10-21 DIAGNOSIS — R29890 Loss of height: Secondary | ICD-10-CM | POA: Diagnosis not present

## 2023-10-21 DIAGNOSIS — M47816 Spondylosis without myelopathy or radiculopathy, lumbar region: Secondary | ICD-10-CM | POA: Diagnosis not present

## 2023-10-21 DIAGNOSIS — M4316 Spondylolisthesis, lumbar region: Secondary | ICD-10-CM | POA: Diagnosis not present

## 2023-10-21 MED ORDER — GADOBUTROL 1 MMOL/ML IV SOLN
7.0000 mL | Freq: Once | INTRAVENOUS | Status: AC | PRN
  Administered 2023-10-21: 7 mL via INTRAVENOUS

## 2023-10-24 ENCOUNTER — Emergency Department (HOSPITAL_COMMUNITY)
Admission: EM | Admit: 2023-10-24 | Discharge: 2023-10-24 | Discharge status: Against Medical Advice | Payer: BC Managed Care – PPO | Attending: Emergency Medicine | Admitting: Emergency Medicine

## 2023-10-24 ENCOUNTER — Encounter (HOSPITAL_COMMUNITY): Payer: Self-pay | Admitting: *Deleted

## 2023-10-24 ENCOUNTER — Encounter (HOSPITAL_COMMUNITY): Payer: Self-pay

## 2023-10-24 ENCOUNTER — Other Ambulatory Visit: Payer: Self-pay

## 2023-10-24 DIAGNOSIS — M25559 Pain in unspecified hip: Secondary | ICD-10-CM | POA: Insufficient documentation

## 2023-10-24 DIAGNOSIS — Z5321 Procedure and treatment not carried out due to patient leaving prior to being seen by health care provider: Secondary | ICD-10-CM | POA: Insufficient documentation

## 2023-10-24 DIAGNOSIS — M545 Low back pain, unspecified: Secondary | ICD-10-CM | POA: Insufficient documentation

## 2023-10-24 DIAGNOSIS — R1110 Vomiting, unspecified: Secondary | ICD-10-CM | POA: Diagnosis not present

## 2023-10-24 DIAGNOSIS — W19XXXA Unspecified fall, initial encounter: Secondary | ICD-10-CM | POA: Insufficient documentation

## 2023-10-24 DIAGNOSIS — Z5329 Procedure and treatment not carried out because of patient's decision for other reasons: Secondary | ICD-10-CM | POA: Insufficient documentation

## 2023-10-24 HISTORY — DX: Cervicalgia: M54.2

## 2023-10-24 HISTORY — DX: Wedge compression fracture of third lumbar vertebra, initial encounter for closed fracture: S32.030A

## 2023-10-24 HISTORY — DX: Unspecified osteoarthritis, unspecified site: M19.90

## 2023-10-24 LAB — COMPREHENSIVE METABOLIC PANEL
ALT: 29 U/L (ref 0–44)
AST: 30 U/L (ref 15–41)
Albumin: 3.7 g/dL (ref 3.5–5.0)
Alkaline Phosphatase: 61 U/L (ref 38–126)
Anion gap: 12 (ref 5–15)
BUN: 16 mg/dL (ref 8–23)
CO2: 20 mmol/L — ABNORMAL LOW (ref 22–32)
Calcium: 8.9 mg/dL (ref 8.9–10.3)
Chloride: 102 mmol/L (ref 98–111)
Creatinine, Ser: 1.13 mg/dL — ABNORMAL HIGH (ref 0.44–1.00)
GFR, Estimated: 55 mL/min — ABNORMAL LOW (ref >60)
Glucose, Bld: 130 mg/dL — ABNORMAL HIGH (ref 70–99)
Potassium: 3.8 mmol/L (ref 3.5–5.1)
Sodium: 134 mmol/L — ABNORMAL LOW (ref 135–145)
Total Bilirubin: 0.7 mg/dL (ref 0.0–1.2)
Total Protein: 7.1 g/dL (ref 6.5–8.1)

## 2023-10-24 LAB — CBC WITH DIFFERENTIAL/PLATELET
Abs Immature Granulocytes: 0.01 10*3/uL (ref 0.00–0.07)
Basophils Absolute: 0 10*3/uL (ref 0.0–0.1)
Basophils Relative: 0 %
Eosinophils Absolute: 0 10*3/uL (ref 0.0–0.5)
Eosinophils Relative: 0 %
HCT: 45.8 % (ref 36.0–46.0)
Hemoglobin: 15.9 g/dL — ABNORMAL HIGH (ref 12.0–15.0)
Immature Granulocytes: 0 %
Lymphocytes Relative: 7 %
Lymphs Abs: 0.4 10*3/uL — ABNORMAL LOW (ref 0.7–4.0)
MCH: 31.3 pg (ref 26.0–34.0)
MCHC: 34.7 g/dL (ref 30.0–36.0)
MCV: 90.2 fL (ref 80.0–100.0)
Monocytes Absolute: 0.3 10*3/uL (ref 0.1–1.0)
Monocytes Relative: 4 %
Neutro Abs: 5.9 10*3/uL (ref 1.7–7.7)
Neutrophils Relative %: 89 %
Platelets: 238 10*3/uL (ref 150–400)
RBC: 5.08 MIL/uL (ref 3.87–5.11)
RDW: 12.7 % (ref 11.5–15.5)
WBC: 6.6 10*3/uL (ref 4.0–10.5)
nRBC: 0 % (ref 0.0–0.2)

## 2023-10-24 MED ORDER — OXYCODONE-ACETAMINOPHEN 5-325 MG PO TABS
1.0000 | ORAL_TABLET | Freq: Once | ORAL | Status: AC
  Administered 2023-10-24: 1 via ORAL
  Filled 2023-10-24: qty 1

## 2023-10-24 NOTE — ED Notes (Signed)
Pt/spouse verbalize, "we just spoke to our NSURG and they told us we went to the wrong hospital and we were suppose to go to [facility], we are leaving and going to [facility] per our NSURG"

## 2023-10-24 NOTE — ED Notes (Signed)
Pt called x2 for vitals with no answer.

## 2023-10-24 NOTE — ED Triage Notes (Signed)
Patient fell last Wednesday and fx L3.  Was suppose to see Chauncey Cruel but was vomiting this morning.  Reports she just can't get comfortable.  Reports pain radiating down bilateral hips.

## 2023-10-24 NOTE — ED Provider Triage Note (Signed)
Emergency Medicine Provider Triage Evaluation Note  Carol Branch , a 63 y.o. female  was evaluated in triage.  Pt complains of lower back pain after her fall last Wednesday.  Reports she had an MRI done and she fractured L3.  Was supposed to see Dr. Wynetta Emery this morning, but did not go due to some nausea and vomiting.  Reports she is having difficulty keeping comfortable and has pain rating down to her hips.  Review of Systems  Positive: As above Negative: As above  Physical Exam  BP 135/80 (BP Location: Right Arm)   Pulse (!) 105   Temp 99 F (37.2 C) (Oral)   Resp 18   Ht 5' 8.5" (1.74 m)   Wt 72.6 kg   SpO2 98%   BMI 23.97 kg/m  Gen:   Awake, no distress   Resp:  Normal effort  MSK:   Moves extremities without difficulty    Medical Decision Making  Medically screening exam initiated at 4:44 PM.  Appropriate orders placed.  Carol Branch was informed that the remainder of the evaluation will be completed by another provider, this initial triage assessment does not replace that evaluation, and the importance of remaining in the ED until their evaluation is complete.     Arabella Merles, PA-C 10/24/23 1645

## 2023-10-24 NOTE — ED Notes (Signed)
Pt called for repeat vitals with no answer.

## 2023-10-24 NOTE — ED Triage Notes (Signed)
BIB family from home for bilateral hip pain. Describes as acute/chronic and progressively worse after recent fall. Mentions MRI on Monday, recent vertebral fx. See in ED on Wednesday. Was unable to make scheduled appt this am with Dr. Riki Sheer d/t [place]. Attempted to be worked in today, but there was no availability. Decided to come here for hip pain worsening.  Also a pt of [facility]. Last emesis 1000. Endorses pain, can't sleep,  nausea and general weakness. "Vomiting has resolved and may have been medicine related".

## 2023-10-29 DIAGNOSIS — Z6826 Body mass index (BMI) 26.0-26.9, adult: Secondary | ICD-10-CM | POA: Diagnosis not present

## 2023-10-29 DIAGNOSIS — S32030A Wedge compression fracture of third lumbar vertebra, initial encounter for closed fracture: Secondary | ICD-10-CM | POA: Diagnosis not present

## 2023-11-05 DIAGNOSIS — S32030A Wedge compression fracture of third lumbar vertebra, initial encounter for closed fracture: Secondary | ICD-10-CM | POA: Diagnosis not present

## 2023-11-11 DIAGNOSIS — H5213 Myopia, bilateral: Secondary | ICD-10-CM | POA: Diagnosis not present

## 2023-11-11 DIAGNOSIS — H524 Presbyopia: Secondary | ICD-10-CM | POA: Diagnosis not present

## 2023-11-26 DIAGNOSIS — Z6826 Body mass index (BMI) 26.0-26.9, adult: Secondary | ICD-10-CM | POA: Diagnosis not present

## 2023-11-26 DIAGNOSIS — S32030A Wedge compression fracture of third lumbar vertebra, initial encounter for closed fracture: Secondary | ICD-10-CM | POA: Diagnosis not present

## 2023-12-02 ENCOUNTER — Other Ambulatory Visit (HOSPITAL_COMMUNITY): Payer: Self-pay | Admitting: Internal Medicine

## 2023-12-02 DIAGNOSIS — S32030A Wedge compression fracture of third lumbar vertebra, initial encounter for closed fracture: Secondary | ICD-10-CM | POA: Diagnosis not present

## 2023-12-02 DIAGNOSIS — Z780 Asymptomatic menopausal state: Secondary | ICD-10-CM | POA: Diagnosis not present

## 2023-12-02 DIAGNOSIS — E7800 Pure hypercholesterolemia, unspecified: Secondary | ICD-10-CM

## 2023-12-25 ENCOUNTER — Ambulatory Visit (HOSPITAL_COMMUNITY)
Admission: RE | Admit: 2023-12-25 | Discharge: 2023-12-25 | Disposition: A | Discharge status: Home / Self Care | Payer: Self-pay | Source: Ambulatory Visit | Attending: Internal Medicine | Admitting: Internal Medicine

## 2023-12-25 DIAGNOSIS — E7800 Pure hypercholesterolemia, unspecified: Secondary | ICD-10-CM | POA: Insufficient documentation

## 2023-12-25 DIAGNOSIS — M810 Age-related osteoporosis without current pathological fracture: Secondary | ICD-10-CM | POA: Diagnosis not present

## 2023-12-26 DIAGNOSIS — S32030A Wedge compression fracture of third lumbar vertebra, initial encounter for closed fracture: Secondary | ICD-10-CM | POA: Diagnosis not present

## 2023-12-26 DIAGNOSIS — M542 Cervicalgia: Secondary | ICD-10-CM | POA: Diagnosis not present

## 2023-12-26 DIAGNOSIS — Z6826 Body mass index (BMI) 26.0-26.9, adult: Secondary | ICD-10-CM | POA: Diagnosis not present

## 2024-02-26 DIAGNOSIS — L821 Other seborrheic keratosis: Secondary | ICD-10-CM | POA: Diagnosis not present

## 2024-02-26 DIAGNOSIS — D229 Melanocytic nevi, unspecified: Secondary | ICD-10-CM | POA: Diagnosis not present

## 2024-02-26 DIAGNOSIS — L578 Other skin changes due to chronic exposure to nonionizing radiation: Secondary | ICD-10-CM | POA: Diagnosis not present

## 2024-03-19 DIAGNOSIS — F39 Unspecified mood [affective] disorder: Secondary | ICD-10-CM | POA: Diagnosis not present

## 2024-03-27 ENCOUNTER — Other Ambulatory Visit: Payer: Self-pay | Admitting: Internal Medicine

## 2024-03-27 DIAGNOSIS — Z0000 Encounter for general adult medical examination without abnormal findings: Secondary | ICD-10-CM | POA: Diagnosis not present

## 2024-03-27 DIAGNOSIS — S32030A Wedge compression fracture of third lumbar vertebra, initial encounter for closed fracture: Secondary | ICD-10-CM | POA: Diagnosis not present

## 2024-03-27 DIAGNOSIS — N644 Mastodynia: Secondary | ICD-10-CM

## 2024-03-27 DIAGNOSIS — F32A Depression, unspecified: Secondary | ICD-10-CM | POA: Diagnosis not present

## 2024-03-27 DIAGNOSIS — M8589 Other specified disorders of bone density and structure, multiple sites: Secondary | ICD-10-CM | POA: Diagnosis not present

## 2024-03-27 DIAGNOSIS — K219 Gastro-esophageal reflux disease without esophagitis: Secondary | ICD-10-CM | POA: Diagnosis not present

## 2024-03-27 DIAGNOSIS — Z79899 Other long term (current) drug therapy: Secondary | ICD-10-CM | POA: Diagnosis not present

## 2024-03-27 DIAGNOSIS — E7800 Pure hypercholesterolemia, unspecified: Secondary | ICD-10-CM | POA: Diagnosis not present

## 2024-04-07 ENCOUNTER — Ambulatory Visit

## 2024-04-07 ENCOUNTER — Ambulatory Visit
Admission: RE | Admit: 2024-04-07 | Discharge: 2024-04-07 | Disposition: A | Discharge status: Home / Self Care | Source: Ambulatory Visit | Attending: Internal Medicine | Admitting: Internal Medicine

## 2024-04-07 DIAGNOSIS — N644 Mastodynia: Secondary | ICD-10-CM | POA: Diagnosis not present

## 2024-04-07 DIAGNOSIS — R92321 Mammographic fibroglandular density, right breast: Secondary | ICD-10-CM | POA: Diagnosis not present

## 2024-06-16 DIAGNOSIS — M25561 Pain in right knee: Secondary | ICD-10-CM | POA: Diagnosis not present

## 2024-06-16 DIAGNOSIS — M25562 Pain in left knee: Secondary | ICD-10-CM | POA: Diagnosis not present

## 2024-07-06 DIAGNOSIS — F4323 Adjustment disorder with mixed anxiety and depressed mood: Secondary | ICD-10-CM | POA: Diagnosis not present

## 2024-07-22 DIAGNOSIS — F4323 Adjustment disorder with mixed anxiety and depressed mood: Secondary | ICD-10-CM | POA: Diagnosis not present

## 2024-07-29 DIAGNOSIS — M2022 Hallux rigidus, left foot: Secondary | ICD-10-CM | POA: Diagnosis not present

## 2024-07-29 DIAGNOSIS — M2031 Hallux varus (acquired), right foot: Secondary | ICD-10-CM | POA: Diagnosis not present
